# Patient Record
Sex: Male | Born: 1995 | Race: Black or African American | Hispanic: No | Marital: Married | Smoking: Never smoker
Health system: Southern US, Community
[De-identification: ages and names within clinical notes are randomized; demographics above are authoritative.]

## PROBLEM LIST (undated history)

## (undated) DIAGNOSIS — S060X9A Concussion with loss of consciousness of unspecified duration, initial encounter: Secondary | ICD-10-CM

## (undated) DIAGNOSIS — J4520 Mild intermittent asthma, uncomplicated: Secondary | ICD-10-CM

## (undated) DIAGNOSIS — S060XAA Concussion with loss of consciousness status unknown, initial encounter: Secondary | ICD-10-CM

## (undated) HISTORY — DX: Mild intermittent asthma, uncomplicated: J45.20

## (undated) HISTORY — PX: OTHER SURGICAL HISTORY: SHX169

## (undated) HISTORY — PX: APPENDECTOMY: SHX54

---
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
## 2015-07-08 IMAGING — CR DG CHEST 2V
2 series · 2 of 2 positions shown · non-contrast
Comparison: 11/26/2012.

CLINICAL DATA: Chest pain.

EXAM:
CHEST  2 VIEW

[w chest pa]
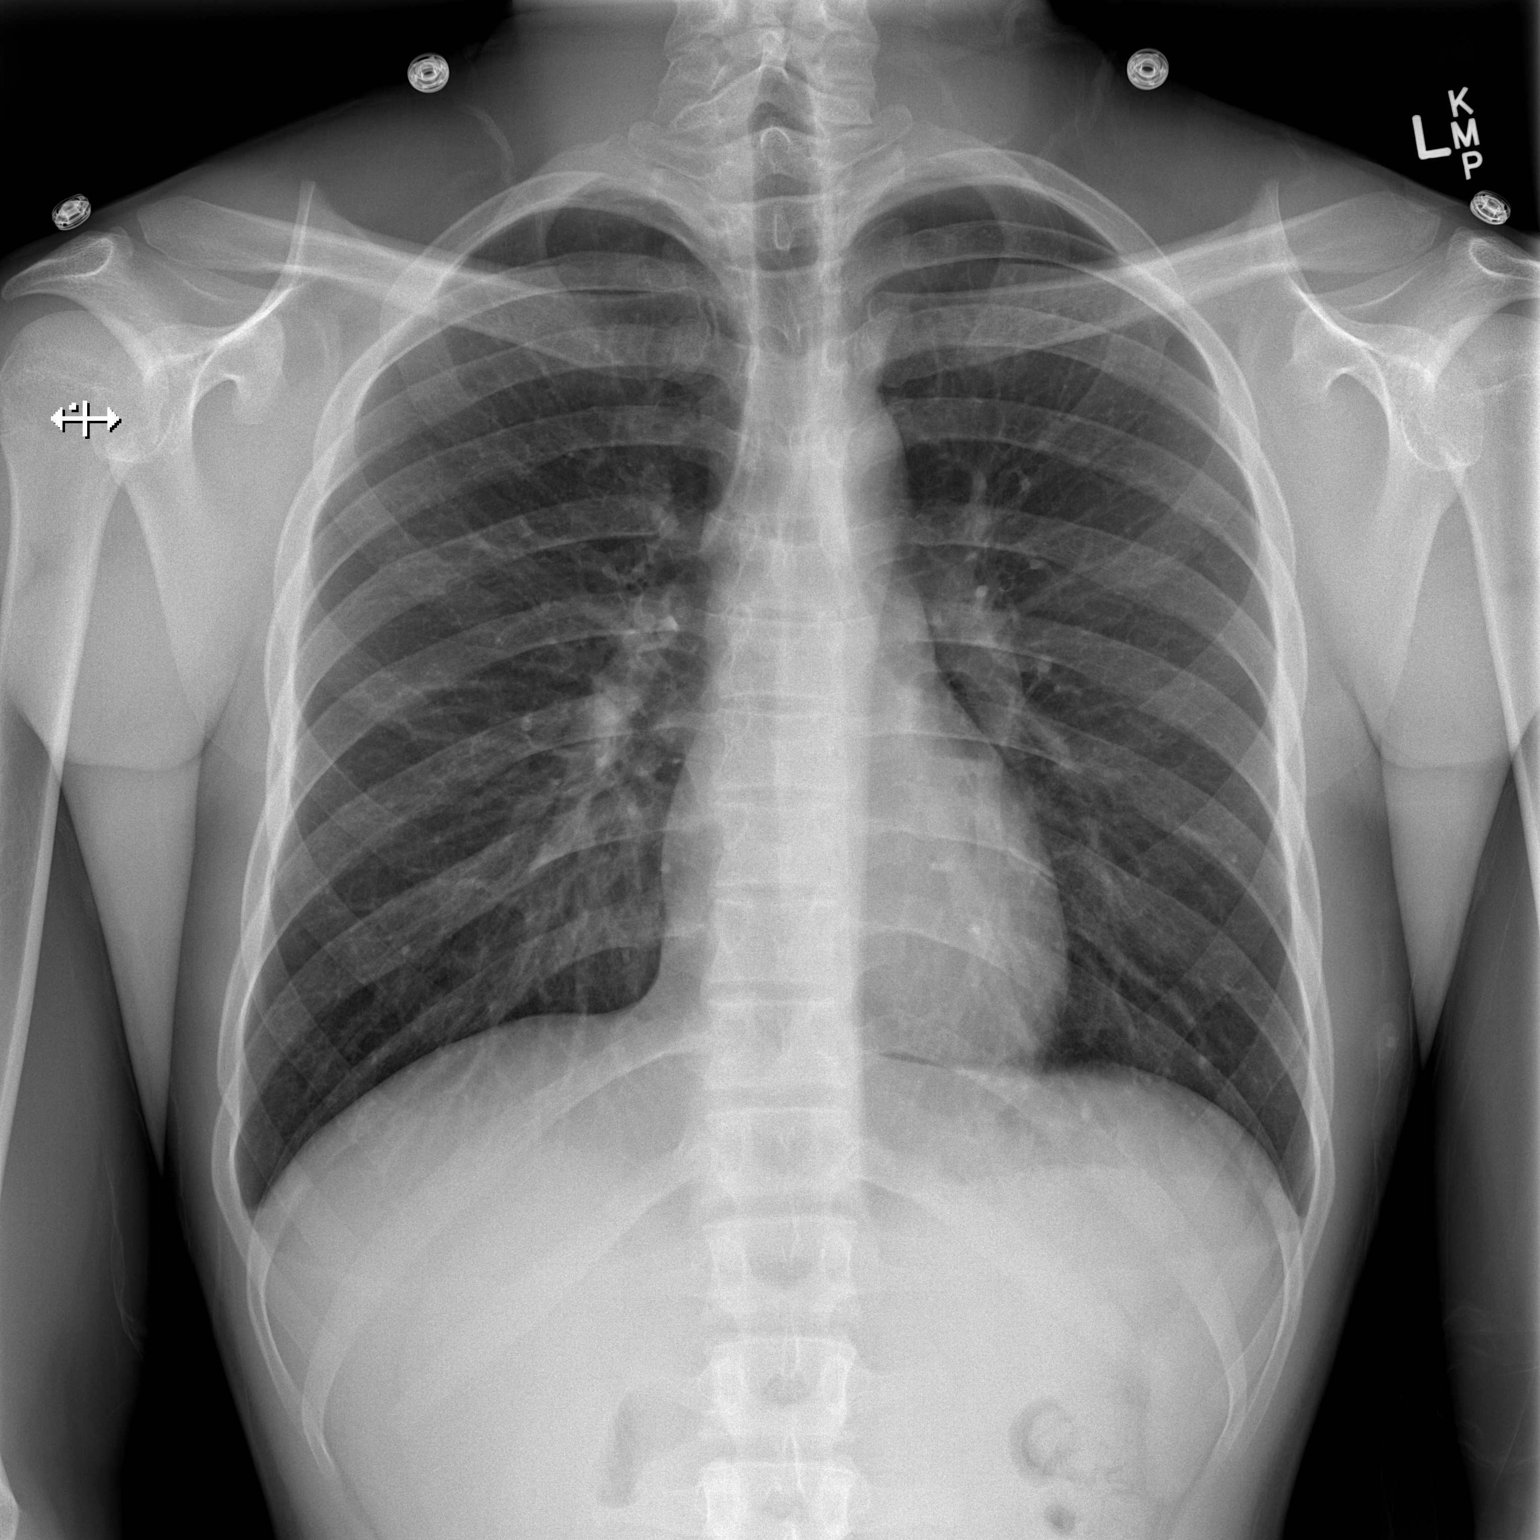

[w chest lat]
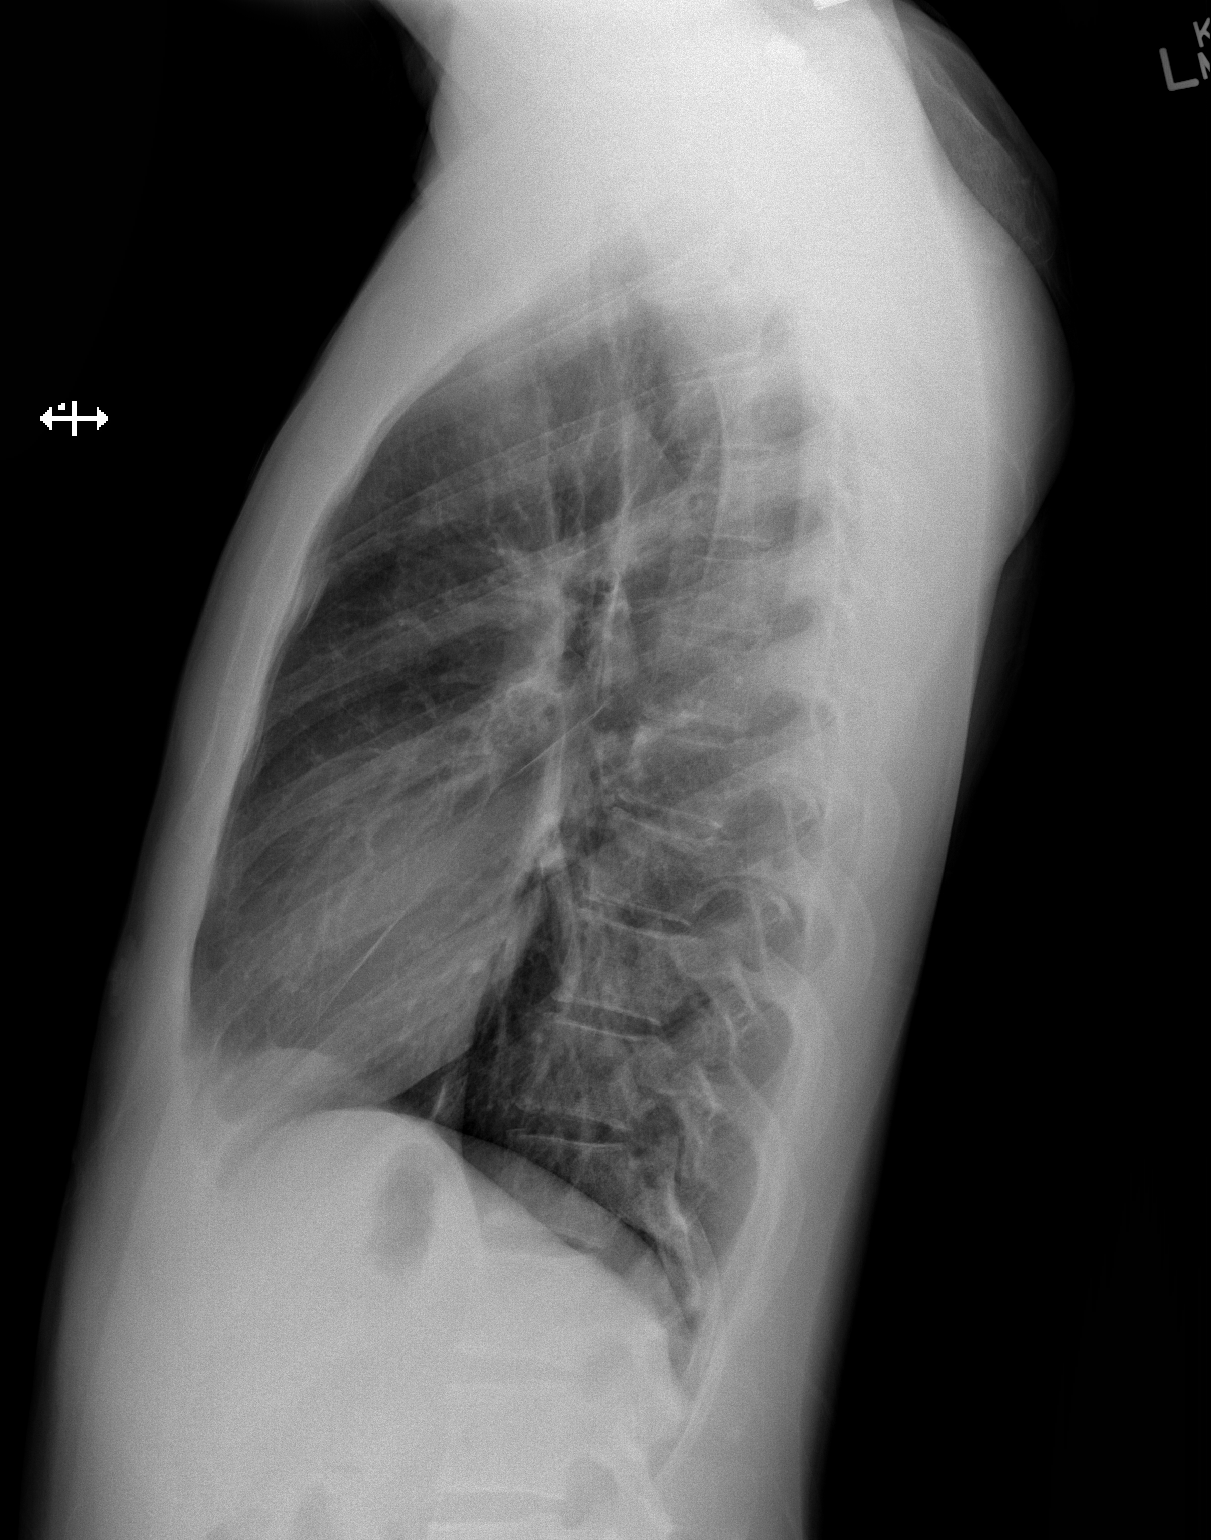

[2 of 2 positions shown; findings below may reference images not displayed]

FINDINGS: Normal heart size and mediastinal contours. No acute infiltrate or
edema. No effusion or pneumothorax. No acute osseous findings.
IMPRESSION: No active cardiopulmonary disease.

## 2016-01-02 ENCOUNTER — Ambulatory Visit: Admission: RE | Admit: 2016-01-02 | Payer: Medicaid Other | Source: Ambulatory Visit

## 2016-01-02 ENCOUNTER — Emergency Department
Admission: EM | Admit: 2016-01-02 | Discharge: 2016-01-02 | Discharge status: Absent without leave | Payer: Medicaid Other | Attending: Emergency Medicine | Admitting: Emergency Medicine

## 2016-01-02 MED ORDER — LORAZEPAM 2 MG/ML IJ SOLN
INTRAMUSCULAR | Status: AC
  Filled 2016-01-02: qty 1

## 2016-01-02 MED ORDER — SODIUM CHLORIDE 0.9 % IV SOLN: 1000.0000 mL | Freq: Once | INTRAVENOUS | Status: DC

## 2017-05-22 ENCOUNTER — Encounter (HOSPITAL_COMMUNITY): Payer: Self-pay | Admitting: Emergency Medicine

## 2017-05-22 ENCOUNTER — Ambulatory Visit (HOSPITAL_COMMUNITY)
Admission: EM | Admit: 2017-05-22 | Discharge: 2017-05-22 | Disposition: A | Discharge status: Home / Self Care | Payer: Self-pay | Attending: Internal Medicine | Admitting: Internal Medicine

## 2017-05-22 DIAGNOSIS — J029 Acute pharyngitis, unspecified: Secondary | ICD-10-CM | POA: Insufficient documentation

## 2017-05-22 DIAGNOSIS — Z79899 Other long term (current) drug therapy: Secondary | ICD-10-CM | POA: Insufficient documentation

## 2017-05-22 DIAGNOSIS — R509 Fever, unspecified: Secondary | ICD-10-CM

## 2017-05-22 DIAGNOSIS — J45909 Unspecified asthma, uncomplicated: Secondary | ICD-10-CM | POA: Insufficient documentation

## 2017-05-22 DIAGNOSIS — R5383 Other fatigue: Secondary | ICD-10-CM

## 2017-05-22 DIAGNOSIS — F322 Major depressive disorder, single episode, severe without psychotic features: Secondary | ICD-10-CM | POA: Insufficient documentation

## 2017-05-22 LAB — POCT INFECTIOUS MONO SCREEN: Mono Screen: NEGATIVE

## 2017-05-22 MED ORDER — AMOXICILLIN 500 MG PO CAPS: 500.0000 mg | ORAL_CAPSULE | Freq: Two times a day (BID) | ORAL | 0 refills | Status: AC

## 2017-05-22 MED ORDER — IBUPROFEN 800 MG PO TABS
ORAL_TABLET | ORAL | Status: AC
  Filled 2017-05-22: qty 1

## 2017-05-22 MED ORDER — ONDANSETRON 4 MG PO TBDP
4.0000 mg | ORAL_TABLET | Freq: Once | ORAL | Status: AC
  Administered 2017-05-22: 4 mg via ORAL

## 2017-05-22 MED ORDER — SODIUM CHLORIDE 0.9 % IV BOLUS
1000.0000 mL | Freq: Once | INTRAVENOUS | Status: AC
  Administered 2017-05-22: 1000 mL via INTRAVENOUS

## 2017-05-22 MED ORDER — ONDANSETRON 4 MG PO TBDP
ORAL_TABLET | ORAL | Status: AC
  Filled 2017-05-22: qty 1

## 2017-05-22 MED ORDER — IBUPROFEN 800 MG PO TABS
800.0000 mg | ORAL_TABLET | Freq: Once | ORAL | Status: AC
  Administered 2017-05-22: 800 mg via ORAL

## 2017-05-22 NOTE — Discharge Instructions (Signed)
You were negative for strep and mono today. We have sent your throat swab to be cultured. Will notify you of any positive findings from your urine, blood and throat, and if any changes to treatment are needed.   Increase your fluid intake. Tylenol and/or ibuprofen as needed for pain or fevers.  Complete course of antibiotics.   If no improvement or if worsening of symptoms in the next 3-5 days please return or go to ER for further evaluation.

## 2017-05-22 NOTE — ED Triage Notes (Signed)
Pt c/o sore throat, fatigue, fever since Friday. Pt has temp of 103, states he has tried both tylenol and ibuprofen less than 6 hours ago, still high fever. Pt states he got dizzy and fell, came awake right away. Pt c/o fatigue, denies hitting head when they fell.

## 2017-05-22 NOTE — ED Provider Notes (Signed)
[facility]    CSN: [id number] Arrival date & time: 05/22/17  1801     History   Chief Complaint Chief Complaint  Patient presents with  . Fever  . Fatigue    HPI Jacob Wolf is a 22 y.o. male.   Jacob presents with complaints of decreased appetite, headache, waekness, fevers, sore throat which started approximately 3/22. Felt like he was going to pass out from weakness this afternoon. Has been drinking some fluids but minimal due to severity of sore throat. Urinating. Has had some loose stool. Mild nausea without vomiting. No known ill contacts. Took theraflu at 1400 today. Ibuprofen yesterday. Without rash. States he is also interested in STD screening. Hx of asthma. Without cough or congestion.     ROS per HPI.      Past Medical History:  Diagnosis Date  . Asthma   . Asthma, mild intermittent 10/29/2011  . Concussion     Patient Active Problem List   Diagnosis Date Noted  . Severe major depression, single episode (HCC) 12/05/2014  . 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 Asthma, mild intermittent 10/29/2011    Past Surgical History:  Procedure Laterality Date  . APPENDECTOMY    . LAPAROSCOPIC APPENDECTOMY  04/16/2011   Procedure: APPENDECTOMY LAPAROSCOPIC;  Surgeon: Judie Petit. Leonia Corona, MD;  Location: [facility];  Service: Pediatrics;  Laterality: N/A;  . Lt toe removal         Home Medications    Prior to Admission medications   Medication Sig Start Date End Date Taking? Authorizing Provider  albuterol (PROVENTIL HFA;VENTOLIN HFA) 108 (90 BASE) MCG/ACT inhaler Inhale 2 puffs into the lungs every 4 (four) hours as needed for wheezing or shortness of breath. For shortness of breath Patient not taking: Reported on 06/27/2014 12/16/11   Preston Fleeting, MD  amoxicillin (AMOXIL) 500 MG capsule Take 1 capsule (500 mg total) by mouth 2 (two) times daily for 10 days. 05/22/17 06/01/17  Georgetta Haber, NP    citalopram (CELEXA) 20 MG tablet Take 1 tablet (20 mg total) by mouth daily. Patient not taking: Reported on 05/22/2017 12/09/14   Beau Fanny, FNP  hydrOXYzine (ATARAX/VISTARIL) 25 MG tablet Take 1 tablet (25 mg total) by mouth every 6 (six) hours as needed for anxiety. Patient not taking: Reported on 05/22/2017 12/09/14   Beau Fanny, FNP  traZODone (DESYREL) 50 MG tablet Take 1 tablet (50 mg total) by mouth at bedtime as needed for sleep. Patient not taking: Reported on 05/22/2017 12/09/14   Beau Fanny, FNP    Family History History reviewed. No pertinent family history.  Social History Social History   Tobacco Use  . Smoking status: Never Smoker  . Smokeless tobacco: Never Used  Substance Use Topics  . Alcohol use: No  . Drug use: No     Allergies   Patient has no known allergies.   Review of Systems Review of Systems   Physical Exam Triage Vital Signs ED Triage Vitals [05/22/17 1820]  Enc Vitals Group     BP 111/65     Pulse Rate (!) 123     Resp 18     Temp (!) 103 F (39.4 C)     Temp src      SpO2 100 %     Weight      Height      Head Circumference      Peak Flow  Pain Score      Pain Loc      Pain Edu?      Excl. in GC?    No data found.  Updated Vital Signs BP 112/66 (BP Location: Right Arm)   Pulse (!) 108   Temp (!) 100.4 F (38 C) (Oral)   Resp 18   SpO2 98%   Visual Acuity Right Eye Distance:   Left Eye Distance:   Bilateral Distance:    Right Eye Near:   Left Eye Near:    Bilateral Near:     Physical Exam  Constitutional: He is oriented to person, place, and time. He appears well-developed and well-nourished.  HENT:  Head: Normocephalic and atraumatic.  Right Ear: Tympanic membrane, external ear and ear canal normal.  Left Ear: Tympanic membrane, external ear and ear canal normal.  Nose: Nose normal. Right sinus exhibits no maxillary sinus tenderness and no frontal sinus tenderness. Left sinus exhibits no  maxillary sinus tenderness and no frontal sinus tenderness.  Mouth/Throat: Uvula is midline, oropharynx is clear and moist and mucous membranes are normal. No tonsillar abscesses. Tonsils are 3+ on the right. Tonsils are 3+ on the left. Tonsillar exudate.  White plaquing to tongue noted with dryness   Eyes: Pupils are equal, round, and reactive to light. Conjunctivae are normal.  Neck: Normal range of motion.  Cardiovascular: Regular rhythm. Tachycardia present.  Pulmonary/Chest: Effort normal and breath sounds normal.  Lymphadenopathy:    He has no cervical adenopathy.  Neurological: He is alert and oriented to person, place, and time.  Skin: Skin is warm and dry.  Vitals reviewed.    UC Treatments / Results  Labs (all labs ordered are listed, but only abnormal results are displayed) Labs Reviewed  CULTURE, GROUP A STREP ([facility])  HIV ANTIBODY (ROUTINE TESTING)  RPR  POCT INFECTIOUS MONO SCREEN  URINE CYTOLOGY ANCILLARY ONLY    EKG None Radiology No results found.  Procedures Procedures (including critical care time)  Medications Ordered in UC Medications  sodium chloride 0.9 % bolus 1,000 mL (1,000 mLs Intravenous New Bag/Given 05/22/17 1923)  ondansetron (ZOFRAN-ODT) disintegrating tablet 4 mg (4 mg Oral Given 05/22/17 1904)  ibuprofen (ADVIL,MOTRIN) tablet 800 mg (800 mg Oral Given 05/22/17 1907)     Initial Impression / Assessment and Plan / UC Course  I have reviewed the triage vital signs and the nursing notes.  Pertinent labs & imaging results that were available during my care of the patient were reviewed by me and considered in my medical decision making (see chart for details).     Negative strep and mono despite significant fever and exam findings. Will treat with amoxicillin at this time. Patient states feels much improved after treatment here. Encouraged to push fluids, continue with tylenol/ibuprofen for pain/fever control. Return precautions provided.  Patient verbalized understanding and agreeable to plan.  Will notify of any positive findings and if any changes to treatment are needed.    Final Clinical Impressions(s) / UC Diagnoses   Final diagnoses:  Acute pharyngitis, unspecified etiology    ED Discharge Orders        Ordered    amoxicillin (AMOXIL) 500 MG capsule  2 times daily     05/22/17 1959       Controlled Substance Prescriptions [place] Controlled Substance Registry consulted? Not Applicable   Georgetta HaberBurky, Zarielle Cea B, NP 05/22/17 2002

## 2017-05-24 LAB — HIV ANTIBODY (ROUTINE TESTING W REFLEX): HIV Screen 4th Generation wRfx: NONREACTIVE

## 2017-05-24 LAB — RPR: RPR: NONREACTIVE

## 2017-05-25 LAB — CULTURE, GROUP A STREP (THRC)

## 2017-05-26 LAB — URINE CYTOLOGY ANCILLARY ONLY
CHLAMYDIA, DNA PROBE: NEGATIVE
NEISSERIA GONORRHEA: NEGATIVE
TRICH (WINDOWPATH): NEGATIVE

## 2017-07-23 ENCOUNTER — Ambulatory Visit (HOSPITAL_COMMUNITY)
Admission: EM | Admit: 2017-07-23 | Discharge: 2017-07-23 | Disposition: A | Discharge status: Home / Self Care | Payer: Medicaid Other | Attending: Family Medicine | Admitting: Family Medicine

## 2017-07-23 ENCOUNTER — Encounter (HOSPITAL_COMMUNITY): Payer: Self-pay | Admitting: Emergency Medicine

## 2017-07-23 DIAGNOSIS — A084 Viral intestinal infection, unspecified: Secondary | ICD-10-CM

## 2017-07-23 MED ORDER — ONDANSETRON 4 MG PO TBDP: 4.0000 mg | ORAL_TABLET | Freq: Three times a day (TID) | ORAL | 0 refills | Status: DC | PRN

## 2017-07-23 NOTE — ED Triage Notes (Signed)
Pt here for N/V and abd pain

## 2017-07-23 NOTE — ED Provider Notes (Signed)
[facility]   [id number] 07/23/17 Arrival Time: 1538  ASSESSMENT & PLAN:  1. Viral gastroenteritis    Meds ordered this encounter  Medications  . ondansetron (ZOFRAN-ODT) 4 MG disintegrating tablet    Sig: Take 1 tablet (4 mg total) by mouth every 8 (eight) hours as needed for nausea or vomiting.    Dispense:  15 tablet    Refill:  0   Discussed typical duration of symptoms for suspected viral GI illness. Will do his best to ensure adequate fluid intake in order to avoid dehydration. Will proceed to the Emergency Department for evaluation if unable to tolerate PO fluids regularly.  Otherwise he will f/u with his PCP or here if not showing improvement over the next 48-72 hours.  Reviewed expectations re: course of current medical issues. Questions answered. Outlined signs and symptoms indicating need for more acute intervention. Patient verbalized understanding. After Visit Summary given.   SUBJECTIVE: History from: patient.  Jacob Wolf is a 22 y.o. male who presents with complaint of non-bloody intermittent nausea and vomiting of undigested food with diarrhea. Onset abrupt, yesterday. Abdominal discomfort: mild and cramping. Symptoms are unchanged since beginning. Aggravating factors: eating. Alleviating factors: none. Associated symptoms: fatigue. He denies fever. Appetite: decreased. PO intake: decreased. Ambulatory without assistance. Urinary symptoms: none. Last bowel movement without blood. OTC treatment: none.  Past Surgical History:  Procedure Laterality Date  . APPENDECTOMY    . LAPAROSCOPIC APPENDECTOMY  04/16/2011   Procedure: APPENDECTOMY LAPAROSCOPIC;  Surgeon: Judie Petit. Leonia Corona, MD;  Location: [facility];  Service: Pediatrics;  Laterality: N/A;  . Lt toe removal      ROS: As per HPI.  OBJECTIVE:  Vitals:   07/23/17 1610  Pulse: 82  Resp: 18  Temp: 98.5 F (36.9 C)  TempSrc: Oral  SpO2: 98%    General appearance: alert; no  distress Oropharynx: moist Lungs: clear to auscultation bilaterally Heart: regular rate and rhythm Abdomen: soft; non-distended; no significant abdominal tenderness, "cramping feeling"; bowel sounds present; no masses or organomegaly; no guarding or rebound tenderness Back: no CVA tenderness Extremities: no edema; symmetrical with no gross deformities Skin: warm and dry Neurologic: normal gait Psychological: alert and cooperative; normal mood and affect   No Known Allergies                                             Past Medical History:  Diagnosis Date  . Asthma   . Asthma, mild intermittent 10/29/2011  . Concussion    Social History   Socioeconomic History  . Marital status: Single    Spouse name: Not on file  . Number of children: Not on file  . Years of education: Not on file  . Highest education level: Not on file  Occupational History  . Not on file  Social Needs  . Financial resource strain: Not on file  . Food insecurity:    Worry: Not on file    Inability: Not on file  . Transportation needs:    Medical: Not on file    Non-medical: Not on file  Tobacco Use  . Smoking status: Never Smoker  . Smokeless tobacco: Never Used  Substance and Sexual Activity  . Alcohol use: No  . Drug use: No  . Sexual activity: Never  Lifestyle  . Physical activity:    Days per week: Not on file  Minutes per session: Not on file  . Stress: Not on file  Relationships  . Social connections:    Talks on phone: Not on file    Gets together: Not on file    Attends religious service: Not on file    Active member of club or organization: Not on file    Attends meetings of clubs or organizations: Not on file    Relationship status: Not on file  . Intimate partner violence:    Fear of current or ex partner: Not on file    Emotionally abused: Not on file    Physically abused: Not on file    Forced sexual activity: Not on file  Other Topics Concern  . Not on file  Social  History Narrative   [employer]   10 th grade.         History reviewed. No pertinent family history.   Mardella Layman, MD 08/07/17 1017

## 2017-07-23 NOTE — Discharge Instructions (Addendum)

## 2018-03-02 ENCOUNTER — Encounter (HOSPITAL_COMMUNITY): Payer: Self-pay

## 2018-03-02 ENCOUNTER — Other Ambulatory Visit: Payer: Self-pay

## 2018-03-02 ENCOUNTER — Ambulatory Visit (HOSPITAL_COMMUNITY)
Admission: EM | Admit: 2018-03-02 | Discharge: 2018-03-02 | Disposition: A | Discharge status: Home / Self Care | Payer: BLUE CROSS/BLUE SHIELD | Attending: Internal Medicine | Admitting: Internal Medicine

## 2018-03-02 DIAGNOSIS — J069 Acute upper respiratory infection, unspecified: Secondary | ICD-10-CM | POA: Diagnosis not present

## 2018-03-02 DIAGNOSIS — B9789 Other viral agents as the cause of diseases classified elsewhere: Secondary | ICD-10-CM | POA: Diagnosis not present

## 2018-03-02 DIAGNOSIS — F5101 Primary insomnia: Secondary | ICD-10-CM | POA: Insufficient documentation

## 2018-03-02 MED ORDER — TRAZODONE HCL 50 MG PO TABS: 50.0000 mg | ORAL_TABLET | Freq: Every evening | ORAL | 0 refills | Status: DC | PRN

## 2018-03-02 NOTE — Discharge Instructions (Signed)
I believe that you have a viral upper respiratory infection that is now improving Work note given I refill your trazodone to help with your sleep Please follow-up with primary care for further evaluation and management

## 2018-03-02 NOTE — ED Triage Notes (Signed)
Pt cc sore throat and congestion pt states he was out in the rain on Friday.

## 2018-03-02 NOTE — ED Provider Notes (Addendum)
[facility]    CSN: [id number] Arrival date & time: 03/02/18  1304     History   Chief Complaint Chief Complaint  Patient presents with  . Sore Throat    HPI Jacob Wolf is a 23 y.o. male.   Is a 23 year old male has medical history of asthma, depression.  He presents with approximately 3 days of cough, congestion, runny nose.  This started on Friday after working outside in the rain.  His symptoms have since improved but his boss wanted him to get a note for work.  He has not been taking anything for his symptoms.  Denies any fever, chills, body, night sweats.  Denies any recent sick contacts or recent traveling.  He is also here with reports of not being able to sleep at night.  This is chronic for him and has gone on  for many years.  He previously was on trazodone to help with sleep a few years back.  He reports this helped him.  Since not taking that anymore he has been using over-the-counter Tylenol PM, Aleve PM, Benadryl and other nighttime remedies.  He reports this helps for approximately 1 to 2 hours but then wakes up with racing thoughts in his head.  He does have a history of depression but denies any depression or anxiety today or suicidal ideations.  Denies any dizziness, headaches, blurred vision.  Denies any chest pain, shortness of breath, palpitations.  ROS per HPI      Past Medical History:  Diagnosis Date  . Asthma   . Asthma, mild intermittent 10/29/2011  . Concussion     Patient Active Problem List   Diagnosis Date Noted  . Severe major depression, single episode (HCC) 12/05/2014  . 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 Asthma, mild intermittent 10/29/2011    Past Surgical History:  Procedure Laterality Date  . APPENDECTOMY    . LAPAROSCOPIC APPENDECTOMY  04/16/2011   Procedure: APPENDECTOMY LAPAROSCOPIC;  Surgeon: Judie PetitM. Leonia CoronaShuaib Farooqui, MD;  Location: [facility];  Service: Pediatrics;   Laterality: N/A;  . Lt toe removal         Home Medications    Prior to Admission medications   Medication Sig Start Date End Date Taking? Authorizing Provider  albuterol (PROVENTIL HFA;VENTOLIN HFA) 108 (90 BASE) MCG/ACT inhaler Inhale 2 puffs into the lungs every 4 (four) hours as needed for wheezing or shortness of breath. For shortness of breath Patient not taking: Reported on 06/27/2014 12/16/11   Preston FleetingHooker, James B, MD  citalopram (CELEXA) 20 MG tablet Take 1 tablet (20 mg total) by mouth daily. Patient not taking: Reported on 05/22/2017 12/09/14   Beau FannyWithrow, John C, FNP  hydrOXYzine (ATARAX/VISTARIL) 25 MG tablet Take 1 tablet (25 mg total) by mouth every 6 (six) hours as needed for anxiety. Patient not taking: Reported on 05/22/2017 12/09/14   Beau FannyWithrow, John C, FNP  ondansetron (ZOFRAN-ODT) 4 MG disintegrating tablet Take 1 tablet (4 mg total) by mouth every 8 (eight) hours as needed for nausea or vomiting. 07/23/17   Mardella LaymanHagler, Brian, MD  traZODone (DESYREL) 50 MG tablet Take 1 tablet (50 mg total) by mouth at bedtime as needed for sleep. 03/02/18   Janace ArisBast, Chavela Justiniano A, NP    Family History History reviewed. No pertinent family history.  Social History Social History   Tobacco Use  . Smoking status: Never Smoker  . Smokeless tobacco: Never Used  Substance Use Topics  . Alcohol use: No  .  Drug use: No     Allergies   Patient has no known allergies.   Review of Systems Review of Systems   Physical Exam Triage Vital Signs ED Triage Vitals  Enc Vitals Group     BP 03/02/18 1354 124/70     Pulse Rate 03/02/18 1354 83     Resp 03/02/18 1354 16     Temp 03/02/18 1354 97.9 F (36.6 C)     Temp src --      SpO2 03/02/18 1354 100 %     Weight 03/02/18 1353 150 lb (68 kg)     Height --      Head Circumference --      Peak Flow --      Pain Score 03/02/18 1352 1     Pain Loc --      Pain Edu? --      Excl. in GC? --    No data found.  Updated Vital Signs BP 124/70 (BP  Location: Right Arm)   Pulse 83   Temp 97.9 F (36.6 C)   Resp 16   Wt 150 lb (68 kg)   SpO2 100%   BMI 24.58 kg/m   Visual Acuity Right Eye Distance:   Left Eye Distance:   Bilateral Distance:    Right Eye Near:   Left Eye Near:    Bilateral Near:     Physical Exam Vitals signs and nursing note reviewed.  Constitutional:      General: He is not in acute distress.    Appearance: He is well-developed. He is not ill-appearing, toxic-appearing or diaphoretic.  HENT:     Head: Normocephalic and atraumatic.     Right Ear: Tympanic membrane and ear canal normal.     Left Ear: Tympanic membrane normal.     Mouth/Throat:     Pharynx: Oropharynx is clear.     Tonsils: No tonsillar exudate. Swelling: 0 on the right. 0 on the left.  Eyes:     Conjunctiva/sclera: Conjunctivae normal.  Neck:     Musculoskeletal: Normal range of motion.  Cardiovascular:     Rate and Rhythm: Normal rate and regular rhythm.     Heart sounds: Normal heart sounds. No murmur.  Pulmonary:     Effort: Pulmonary effort is normal.  Lymphadenopathy:     Cervical: No cervical adenopathy.  Skin:    General: Skin is warm and dry.  Neurological:     General: No focal deficit present.     Mental Status: He is alert.  Psychiatric:        Mood and Affect: Mood normal.      UC Treatments / Results  Labs (all labs ordered are listed, but only abnormal results are displayed) Labs Reviewed - No data to display  EKG None  Radiology No results found.  Procedures Procedures (including critical care time)  Medications Ordered in UC Medications - No data to display  Initial Impression / Assessment and Plan / UC Course  I have reviewed the triage vital signs and the nursing notes.  Pertinent labs & imaging results that were available during my care of the patient were reviewed by me and considered in my medical decision making (see chart for details).     Exam normal Note given for work  I think  it would be appropriate to give patient a few trazodone until he can get follow-up with primary care provider for chronic management.  Pt denies any depression, anxiety or SI  today.  Primary care provider contacts placed on discharge instructions.  Final Clinical Impressions(s) / UC Diagnoses   Final diagnoses:  Primary insomnia  Viral URI with cough     Discharge Instructions     I believe that you have a viral upper respiratory infection that is now improving Work note given I refill your trazodone to help with your sleep Please follow-up with primary care for further evaluation and management    ED Prescriptions    Medication Sig Dispense Auth. Provider   traZODone (DESYREL) 50 MG tablet Take 1 tablet (50 mg total) by mouth at bedtime as needed for sleep. 14 tablet Dahlia Byes A, NP     Controlled Substance Prescriptions [place] Controlled Substance Registry consulted? Not Applicable        Janace Aris, NP 03/02/18 1505

## 2018-03-15 ENCOUNTER — Other Ambulatory Visit: Payer: Self-pay

## 2018-03-15 ENCOUNTER — Encounter (HOSPITAL_COMMUNITY): Payer: Self-pay | Admitting: Emergency Medicine

## 2018-03-15 ENCOUNTER — Ambulatory Visit (HOSPITAL_COMMUNITY)
Admission: EM | Admit: 2018-03-15 | Discharge: 2018-03-15 | Disposition: A | Discharge status: Home / Self Care | Payer: BLUE CROSS/BLUE SHIELD | Attending: Family Medicine | Admitting: Family Medicine

## 2018-03-15 DIAGNOSIS — Z760 Encounter for issue of repeat prescription: Secondary | ICD-10-CM | POA: Diagnosis not present

## 2018-03-15 DIAGNOSIS — F419 Anxiety disorder, unspecified: Secondary | ICD-10-CM | POA: Diagnosis not present

## 2018-03-15 MED ORDER — HYDROXYZINE HCL 25 MG PO TABS: 25.0000 mg | ORAL_TABLET | Freq: Four times a day (QID) | ORAL | 0 refills | Status: DC | PRN

## 2018-03-15 MED ORDER — TRAZODONE HCL 50 MG PO TABS: 50.0000 mg | ORAL_TABLET | Freq: Every evening | ORAL | 1 refills | Status: DC | PRN

## 2018-03-15 MED ORDER — CITALOPRAM HYDROBROMIDE 20 MG PO TABS: 20.0000 mg | ORAL_TABLET | Freq: Every day | ORAL | 1 refills | Status: DC

## 2018-03-15 NOTE — ED Triage Notes (Addendum)
Patient wants refill for medication  Patient seen a few weeks ago for uri symptoms.  At that time says provider offered to refill depression and anxiety medicines-patient declined offer.  Today he has returned hopeful to get medication refilled

## 2018-03-15 NOTE — Discharge Instructions (Addendum)
Medicines refilled See the Primary care Doctor in follow up

## 2018-03-15 NOTE — ED Provider Notes (Signed)
[facility]    CSN: [id number] Arrival date & time: 03/15/18  1425     History   Chief Complaint Chief Complaint  Patient presents with  . Anxiety    HPI Jacob Wolf is a 23 y.o. male.   HPI Patient previously hospitalized for depression.  Took Celexa, trazodone for sleep, hydroxyzine for anxiety.  He is here requesting a refill of these medications.  He does not have a PCP.  He states he feels like he is "out of control".  Very anxious.  Easily angry.  Argumentative with his girlfriend.  He would like to get back on medication until he can get in with a PCP.  He does not feel like he needs a psychiatrist, no thoughts of harming himself or others.  He did well on these medications before Past Medical History:  Diagnosis Date  . Asthma   . Asthma, mild intermittent 10/29/2011  . Concussion     Patient Active Problem List   Diagnosis Date Noted  . Severe major depression, single episode (HCC) 12/05/2014  . 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 Asthma, mild intermittent 10/29/2011    Past Surgical History:  Procedure Laterality Date  . APPENDECTOMY    . LAPAROSCOPIC APPENDECTOMY  04/16/2011   Procedure: APPENDECTOMY LAPAROSCOPIC;  Surgeon: Judie PetitM. Leonia CoronaShuaib Farooqui, MD;  Location: [facility];  Service: Pediatrics;  Laterality: N/A;  . Lt toe removal         Home Medications    Prior to Admission medications   Medication Sig Start Date End Date Taking? Authorizing Provider  citalopram (CELEXA) 20 MG tablet Take 1 tablet (20 mg total) by mouth daily. 03/15/18   Eustace MooreNelson, Herminia Warren Sue, MD  hydrOXYzine (ATARAX/VISTARIL) 25 MG tablet Take 1 tablet (25 mg total) by mouth every 6 (six) hours as needed for anxiety. 03/15/18   Eustace MooreNelson, Ravenne Wayment Sue, MD  traZODone (DESYREL) 50 MG tablet Take 1 tablet (50 mg total) by mouth at bedtime as needed for sleep. 03/15/18   Eustace MooreNelson, Jamien Casanova Sue, MD    Family History No family history on  file.  Social History Social History   Tobacco Use  . Smoking status: Never Smoker  . Smokeless tobacco: Never Used  Substance Use Topics  . Alcohol use: No  . Drug use: No     Allergies   Patient has no known allergies.   Review of Systems Review of Systems  Constitutional: Negative for chills and fever.  HENT: Negative for ear pain and sore throat.   Eyes: Negative for pain and visual disturbance.  Respiratory: Negative for cough and shortness of breath.   Cardiovascular: Negative for chest pain and palpitations.  Gastrointestinal: Negative for abdominal pain and vomiting.  Genitourinary: Negative for dysuria and hematuria.  Musculoskeletal: Negative for arthralgias and back pain.  Skin: Negative for color change and rash.  Neurological: Negative for seizures and syncope.  Psychiatric/Behavioral: Positive for dysphoric mood and sleep disturbance. The patient is nervous/anxious.   All other systems reviewed and are negative.    Physical Exam Triage Vital Signs ED Triage Vitals  Enc Vitals Group     BP 03/15/18 1626 108/67     Pulse Rate 03/15/18 1626 61     Resp 03/15/18 1626 16     Temp 03/15/18 1626 98.3 F (36.8 C)     Temp Source 03/15/18 1626 Temporal     SpO2 03/15/18 1626 100 %     Weight --  Height --      Head Circumference --      Peak Flow --      Pain Score 03/15/18 1622 0     Pain Loc --      Pain Edu? --      Excl. in GC? --    No data found.  Updated Vital Signs BP 108/67 (BP Location: Right Arm)   Pulse 61   Temp 98.3 F (36.8 C) (Temporal)   Resp 16   SpO2 100%   Visual Acuity Right Eye Distance:   Left Eye Distance:   Bilateral Distance:    Right Eye Near:   Left Eye Near:    Bilateral Near:     Physical Exam Constitutional:      General: He is not in acute distress.    Appearance: He is well-developed.  HENT:     Head: Normocephalic and atraumatic.  Eyes:     Conjunctiva/sclera: Conjunctivae normal.     Pupils:  Pupils are equal, round, and reactive to light.  Neck:     Musculoskeletal: Normal range of motion.  Cardiovascular:     Rate and Rhythm: Normal rate.  Pulmonary:     Effort: Pulmonary effort is normal. No respiratory distress.  Abdominal:     General: There is no distension.     Palpations: Abdomen is soft.  Musculoskeletal: Normal range of motion.  Skin:    General: Skin is warm and dry.  Neurological:     Mental Status: He is alert.  Psychiatric:        Mood and Affect: Mood normal.        Behavior: Behavior normal.        Thought Content: Thought content normal.     Comments: Clear thinking.  Good judgment.      UC Treatments / Results  Labs (all labs ordered are listed, but only abnormal results are displayed) Labs Reviewed - No data to display  EKG None  Radiology No results found.  Procedures Procedures (including critical care time)  Medications Ordered in UC Medications - No data to display  Initial Impression / Assessment and Plan / UC Course  I have reviewed the triage vital signs and the nursing notes.  Pertinent labs & imaging results that were available during my care of the patient were reviewed by me and considered in my medical decision making (see chart for details).     Reviewed that he does need to be on an SSRI this is the drug of choice for anxiety and depression.  Discussed how the medication works.  He was on it before with good results.  He would like to go back on the same medicine as previous.  I did explain to him that this is a chronic condition that needs to be managed by primary care doctor.  I given him the number for primary care doctor to follow-up with.  I gave him enough medicine to last until he can be seen by the doctor. Final Clinical Impressions(s) / UC Diagnoses   Final diagnoses:  Anxiety  Encounter for medication refill     Discharge Instructions     Medicines refilled See the Primary care Doctor in follow  up   ED Prescriptions    Medication Sig Dispense Auth. Provider   citalopram (CELEXA) 20 MG tablet Take 1 tablet (20 mg total) by mouth daily. 30 tablet Eustace Moore, MD   hydrOXYzine (ATARAX/VISTARIL) 25 MG tablet Take 1 tablet (25  mg total) by mouth every 6 (six) hours as needed for anxiety. 30 tablet Eustace MooreNelson, Saban Heinlen Sue, MD   traZODone (DESYREL) 50 MG tablet Take 1 tablet (50 mg total) by mouth at bedtime as needed for sleep. 30 tablet Eustace MooreNelson, Vassie Kugel Sue, MD     Controlled Substance Prescriptions [place] Controlled Substance Registry consulted? Not Applicable   Eustace MooreNelson, Spruha Weight Sue, MD 03/15/18 2044

## 2018-05-12 ENCOUNTER — Encounter (HOSPITAL_COMMUNITY): Payer: Self-pay

## 2018-05-12 ENCOUNTER — Ambulatory Visit (HOSPITAL_COMMUNITY)
Admission: EM | Admit: 2018-05-12 | Discharge: 2018-05-12 | Disposition: A | Discharge status: Home / Self Care | Payer: BLUE CROSS/BLUE SHIELD | Attending: Family Medicine | Admitting: Family Medicine

## 2018-05-12 ENCOUNTER — Other Ambulatory Visit: Payer: Self-pay

## 2018-05-12 DIAGNOSIS — Z760 Encounter for issue of repeat prescription: Secondary | ICD-10-CM

## 2018-05-12 DIAGNOSIS — Z8709 Personal history of other diseases of the respiratory system: Secondary | ICD-10-CM

## 2018-05-12 DIAGNOSIS — Z029 Encounter for administrative examinations, unspecified: Secondary | ICD-10-CM | POA: Diagnosis not present

## 2018-05-12 DIAGNOSIS — J029 Acute pharyngitis, unspecified: Secondary | ICD-10-CM

## 2018-05-12 MED ORDER — HYDROXYZINE HCL 25 MG PO TABS: 25.0000 mg | ORAL_TABLET | Freq: Four times a day (QID) | ORAL | 1 refills | Status: DC | PRN

## 2018-05-12 NOTE — ED Triage Notes (Signed)
Pt presents to get a letter to be cleared to go back to work after having a sore throat for a few days; pt states his throat is better and he is not having any other symptoms.

## 2018-05-12 NOTE — ED Provider Notes (Signed)
[facility]    CSN: [id number] Arrival date & time: 05/12/18  1705     History   Chief Complaint Chief Complaint  Patient presents with  . Medical Letter Back To Work    HPI Jacob Wolf is a 23 y.o. male.   HPI  Patient was out sick for 2 days with a sore throat.  He tried to go back to work today and they will not allow him to come back without a work note.  He states he still has a mild sore throat but it is much improved.  No runny stuffy nose.  No coughing or chest congestion.  He does not think he had a fever.  He is otherwise healthy. He also requests a refill of hydroxyzine that he uses for anxiety. He is reminded that he needs to go see a PCP for medicine refills  Past Medical History:  Diagnosis Date  . Asthma   . Asthma, mild intermittent 10/29/2011  . Concussion     Patient Active Problem List   Diagnosis Date Noted  . Severe major depression, single episode (HCC) 12/05/2014  . 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 Asthma, mild intermittent 10/29/2011    Past Surgical History:  Procedure Laterality Date  . APPENDECTOMY    . LAPAROSCOPIC APPENDECTOMY  04/16/2011   Procedure: APPENDECTOMY LAPAROSCOPIC;  Surgeon: Judie Petit. Leonia Corona, MD;  Location: [facility];  Service: Pediatrics;  Laterality: N/A;  . Lt toe removal         Home Medications    Prior to Admission medications   Medication Sig Start Date End Date Taking? Authorizing Provider  citalopram (CELEXA) 20 MG tablet Take 1 tablet (20 mg total) by mouth daily. 03/15/18   Eustace Moore, MD  hydrOXYzine (ATARAX/VISTARIL) 25 MG tablet Take 1 tablet (25 mg total) by mouth every 6 (six) hours as needed for anxiety. 05/12/18   Eustace Moore, MD  traZODone (DESYREL) 50 MG tablet Take 1 tablet (50 mg total) by mouth at bedtime as needed for sleep. 03/15/18   Eustace Moore, MD    Family History History reviewed. No pertinent family  history.  Social History Social History   Tobacco Use  . Smoking status: Never Smoker  . Smokeless tobacco: Never Used  Substance Use Topics  . Alcohol use: No  . Drug use: No     Allergies   Patient has no known allergies.   Review of Systems Review of Systems  Constitutional: Negative for chills and fever.  HENT: Positive for sore throat. Negative for ear pain.   Eyes: Negative for pain and visual disturbance.  Respiratory: Negative for cough and shortness of breath.   Cardiovascular: Negative for chest pain and palpitations.  Gastrointestinal: Negative for abdominal pain and vomiting.  Genitourinary: Negative for dysuria and hematuria.  Musculoskeletal: Negative for arthralgias and back pain.  Skin: Negative for color change and rash.  Neurological: Negative for seizures and syncope.  All other systems reviewed and are negative.    Physical Exam Triage Vital Signs ED Triage Vitals  Enc Vitals Group     BP 05/12/18 1728 121/70     Pulse Rate 05/12/18 1728 96     Resp 05/12/18 1728 20     Temp 05/12/18 1728 98.2 F (36.8 C)     Temp Source 05/12/18 1728 Oral     SpO2 05/12/18 1728 98 %     Weight --  Height --      Head Circumference --      Peak Flow --      Pain Score 05/12/18 1726 0     Pain Loc --      Pain Edu? --      Excl. in GC? --    No data found.  Updated Vital Signs BP 121/70 (BP Location: Right Arm)   Pulse 96   Temp 98.2 F (36.8 C) (Oral)   Resp 20   SpO2 98%      Physical Exam Constitutional:      General: He is not in acute distress.    Appearance: He is well-developed.  HENT:     Head: Normocephalic and atraumatic.     Mouth/Throat:     Pharynx: Posterior oropharyngeal erythema present.     Comments: Mild erythema.  No exudate. Eyes:     Conjunctiva/sclera: Conjunctivae normal.     Pupils: Pupils are equal, round, and reactive to light.  Neck:     Musculoskeletal: Normal range of motion.  Cardiovascular:     Rate  and Rhythm: Normal rate.  Pulmonary:     Effort: Pulmonary effort is normal. No respiratory distress.  Abdominal:     General: There is no distension.     Palpations: Abdomen is soft.  Musculoskeletal: Normal range of motion.  Lymphadenopathy:     Cervical: Cervical adenopathy present.  Skin:    General: Skin is warm and dry.  Neurological:     Mental Status: He is alert.      UC Treatments / Results  Labs (all labs ordered are listed, but only abnormal results are displayed) Labs Reviewed - No data to display  EKG None  Radiology No results found.  Procedures Procedures (including critical care time)  Medications Ordered in UC Medications - No data to display  Initial Impression / Assessment and Plan / UC Course  I have reviewed the triage vital signs and the nursing notes.  Pertinent labs & imaging results that were available during my care of the patient were reviewed by me and considered in my medical decision making (see chart for details).      Final Clinical Impressions(s) / UC Diagnoses   Final diagnoses:  History of sore throat     Discharge Instructions     Call and set up an appointment with primary care for additional refills   ED Prescriptions    Medication Sig Dispense Auth. Provider   hydrOXYzine (ATARAX/VISTARIL) 25 MG tablet Take 1 tablet (25 mg total) by mouth every 6 (six) hours as needed for anxiety. 30 tablet Eustace Moore, MD     Controlled Substance Prescriptions [place] Controlled Substance Registry consulted? Not Applicable   Eustace Moore, MD 05/12/18 2050

## 2018-05-12 NOTE — Discharge Instructions (Addendum)
Call and set up an appointment with primary care for additional refills

## 2018-05-20 ENCOUNTER — Ambulatory Visit (HOSPITAL_COMMUNITY)
Admission: EM | Admit: 2018-05-20 | Discharge: 2018-05-20 | Disposition: A | Discharge status: Home / Self Care | Payer: BLUE CROSS/BLUE SHIELD | Attending: Family Medicine | Admitting: Family Medicine

## 2018-05-20 ENCOUNTER — Encounter (HOSPITAL_COMMUNITY): Payer: Self-pay | Admitting: Emergency Medicine

## 2018-05-20 ENCOUNTER — Other Ambulatory Visit: Payer: Self-pay

## 2018-05-20 DIAGNOSIS — Z0289 Encounter for other administrative examinations: Secondary | ICD-10-CM

## 2018-05-20 DIAGNOSIS — K30 Functional dyspepsia: Secondary | ICD-10-CM

## 2018-05-20 NOTE — ED Triage Notes (Signed)
Pt called out of work yesterday for a stomach ache.  Pt is having to get a work note to return to work.  Pt states he feels better today with no cough, SOB or fever.

## 2018-05-28 NOTE — ED Provider Notes (Signed)
[facility]   [id number] 05/20/18 Arrival Time: 1649  ASSESSMENT & PLAN:  1. Upset stomach    Symptoms have resolved. Work note provided.  Reviewed expectations re: course of current medical issues. Questions answered. Outlined signs and symptoms indicating need for more acute intervention. Patient verbalized understanding. After Visit Summary given.   SUBJECTIVE: History from: patient.  Jacob Wolf is a 23 y.o. male who reports having "a stomach ache" yesterday. "Think I've been constipated." BM relieved symptoms. He did call out of work and needs a note to return to work. Afebrile. No n/v. No current abdominal pains. Appetite: normal. PO intake: normal. Ambulatory without assistance. Urinary symptoms: none. Sick contacts: none. Recent travel or camping: none. OTC treatment: none.  Past Surgical History:  Procedure Laterality Date  . APPENDECTOMY    . LAPAROSCOPIC APPENDECTOMY  04/16/2011   Procedure: APPENDECTOMY LAPAROSCOPIC;  Surgeon: Judie Petit. Leonia Corona, MD;  Location: [facility];  Service: Pediatrics;  Laterality: N/A;  . Lt toe removal     ROS: As per HPI.  OBJECTIVE:  Vitals:   05/20/18 1713  BP: 119/73  Pulse: 94  Resp: 12  Temp: 98.3 F (36.8 C)  TempSrc: Oral  SpO2: 99%    General appearance: alert; no distress Oropharynx: moist Lungs: clear to auscultation bilaterally; unlabored Heart: regular rate and rhythm Abdomen: soft; non-distended; no tenderness Back: no CVA tenderness Extremities: no edema; symmetrical with no gross deformities Skin: warm; dry Neurologic: normal gait Psychological: alert and cooperative; normal mood and affect  No Known Allergies                                             Past Medical History:  Diagnosis Date  . Asthma   . Asthma, mild intermittent 10/29/2011  . Concussion    Social History   Socioeconomic History  . Marital status: Single    Spouse name: Not on file  . Number of children: Not on file   . Years of education: Not on file  . Highest education level: Not on file  Occupational History  . Not on file  Social Needs  . Financial resource strain: Not on file  . Food insecurity:    Worry: Not on file    Inability: Not on file  . Transportation needs:    Medical: Not on file    Non-medical: Not on file  Tobacco Use  . Smoking status: Never Smoker  . Smokeless tobacco: Never Used  Substance and Sexual Activity  . Alcohol use: No  . Drug use: No  . Sexual activity: Never  Lifestyle  . Physical activity:    Days per week: Not on file    Minutes per session: Not on file  . Stress: Not on file  Relationships  . Social connections:    Talks on phone: Not on file    Gets together: Not on file    Attends religious service: Not on file    Active member of club or organization: Not on file    Attends meetings of clubs or organizations: Not on file    Relationship status: Not on file  . Intimate partner violence:    Fear of current or ex partner: Not on file    Emotionally abused: Not on file    Physically abused: Not on file    Forced sexual activity: Not on  file  Other Topics Concern  . Not on file  Social History Narrative   [employer]   10 th grade.         History reviewed. No pertinent family history.   Mardella Layman, MD 05/28/18 1000

## 2019-06-14 ENCOUNTER — Emergency Department (HOSPITAL_COMMUNITY)
Admission: EM | Admit: 2019-06-14 | Discharge: 2019-06-15 | Disposition: A | Discharge status: Home / Self Care | Payer: BLUE CROSS/BLUE SHIELD | Attending: Emergency Medicine | Admitting: Emergency Medicine

## 2019-06-14 ENCOUNTER — Encounter (HOSPITAL_COMMUNITY): Payer: Self-pay | Admitting: Emergency Medicine

## 2019-06-14 ENCOUNTER — Other Ambulatory Visit: Payer: Self-pay

## 2019-06-14 ENCOUNTER — Emergency Department (HOSPITAL_COMMUNITY): Payer: BLUE CROSS/BLUE SHIELD

## 2019-06-14 DIAGNOSIS — Z5321 Procedure and treatment not carried out due to patient leaving prior to being seen by health care provider: Secondary | ICD-10-CM | POA: Insufficient documentation

## 2019-06-14 DIAGNOSIS — R0789 Other chest pain: Secondary | ICD-10-CM | POA: Diagnosis not present

## 2019-06-14 LAB — BASIC METABOLIC PANEL
Anion gap: 13 (ref 5–15)
BUN: 9 mg/dL (ref 6–20)
CO2: 29 mmol/L (ref 22–32)
Calcium: 9.3 mg/dL (ref 8.9–10.3)
Chloride: 99 mmol/L (ref 98–111)
Creatinine, Ser: 0.98 mg/dL (ref 0.61–1.24)
GFR calc Af Amer: >60 mL/min (ref >60)
GFR calc non Af Amer: >60 mL/min (ref >60)
Glucose, Bld: 94 mg/dL (ref 70–99)
Potassium: 3.5 mmol/L (ref 3.5–5.1)
Sodium: 141 mmol/L (ref 135–145)

## 2019-06-14 LAB — CBC
HCT: 49.7 % (ref 39.0–52.0)
Hemoglobin: 16.7 g/dL (ref 13.0–17.0)
MCH: 30.2 pg (ref 26.0–34.0)
MCHC: 33.6 g/dL (ref 30.0–36.0)
MCV: 89.9 fL (ref 80.0–100.0)
Platelets: 217 10*3/uL (ref 150–400)
RBC: 5.53 MIL/uL (ref 4.22–5.81)
RDW: 11.8 % (ref 11.5–15.5)
WBC: 5.3 10*3/uL (ref 4.0–10.5)
nRBC: 0 % (ref 0.0–0.2)

## 2019-06-14 LAB — TROPONIN I (HIGH SENSITIVITY): Troponin I (High Sensitivity): <2 ng/L (ref <18)

## 2019-06-14 MED ORDER — SODIUM CHLORIDE 0.9% FLUSH: 3.0000 mL | Freq: Once | INTRAVENOUS | Status: DC

## 2019-06-14 NOTE — ED Triage Notes (Signed)
Pt reports left sided chest "stinging."  States it hurts more when it moves certain ways.  Denies any other symptoms.

## 2019-06-14 NOTE — ED Notes (Signed)
Pt left waiting room

## 2019-06-15 ENCOUNTER — Emergency Department (HOSPITAL_COMMUNITY): Payer: BLUE CROSS/BLUE SHIELD

## 2019-06-15 ENCOUNTER — Encounter (HOSPITAL_COMMUNITY): Payer: Self-pay | Admitting: *Deleted

## 2019-06-15 ENCOUNTER — Emergency Department (HOSPITAL_COMMUNITY)
Admission: EM | Admit: 2019-06-15 | Discharge: 2019-06-15 | Discharge status: Against Medical Advice | Payer: BLUE CROSS/BLUE SHIELD | Source: Home / Self Care

## 2019-06-15 NOTE — ED Triage Notes (Signed)
Pt states he is having left sided chest discomfort with movement. "Feels like it is beating hard at times"

## 2019-06-15 NOTE — ED Notes (Signed)
Pt left waiting room

## 2021-02-26 ENCOUNTER — Emergency Department (HOSPITAL_COMMUNITY)
Admission: EM | Admit: 2021-02-26 | Discharge: 2021-02-27 | Disposition: A | Discharge status: Home / Self Care | Payer: BLUE CROSS/BLUE SHIELD | Attending: Emergency Medicine | Admitting: Emergency Medicine

## 2021-02-26 ENCOUNTER — Encounter (HOSPITAL_COMMUNITY): Payer: Self-pay | Admitting: Emergency Medicine

## 2021-02-26 ENCOUNTER — Other Ambulatory Visit: Payer: Self-pay

## 2021-02-26 ENCOUNTER — Emergency Department (HOSPITAL_COMMUNITY): Payer: BLUE CROSS/BLUE SHIELD

## 2021-02-26 DIAGNOSIS — S0993XA Unspecified injury of face, initial encounter: Secondary | ICD-10-CM | POA: Diagnosis present

## 2021-02-26 DIAGNOSIS — M79641 Pain in right hand: Secondary | ICD-10-CM | POA: Diagnosis not present

## 2021-02-26 DIAGNOSIS — Y9372 Activity, wrestling: Secondary | ICD-10-CM | POA: Diagnosis not present

## 2021-02-26 DIAGNOSIS — S01111A Laceration without foreign body of right eyelid and periocular area, initial encounter: Secondary | ICD-10-CM | POA: Insufficient documentation

## 2021-02-26 DIAGNOSIS — W1839XA Other fall on same level, initial encounter: Secondary | ICD-10-CM | POA: Insufficient documentation

## 2021-02-26 NOTE — ED Provider Notes (Signed)
Emergency Medicine Provider Triage Evaluation Note  Jacob Wolf , a 26 y.o. male  was evaluated in triage.  Pt complains of laceration over right eyebrow.  He states that last night at about 10 PM he was wrestling.  He reports unknown last tetanus. He also has pain in his right fifth MCP joint and distal right fifth metacarpal.  He is able to make a fist however reports it is painful.  Review of Systems  Positive: Laceration on right eyebrow, right hand pain Negative: Wound on right hand, syncope, headache  Physical Exam  BP 140/74    Pulse 73    Temp 98.5 F (36.9 C) (Oral)    Resp 17    Ht 5\' 8"  (1.727 m)    Wt 82 kg    SpO2 99%    BMI 27.49 kg/m  Gen:   Awake, no distress   Resp:  Normal effort  MSK:   Moves extremities without difficulty, there is tenderness to palpation over the distal right fifth metacarpal with limited range of motion secondary to pain.  Mild edema.  No wounds in this area visualized. Other:  Superficial laceration over the lateral aspect of the right eyebrow.  No active bleeding.  Medical Decision Making  Medically screening exam initiated at 10:54 PM.  Appropriate orders placed.  C Maberry was informed that the remainder of the evaluation will be completed by another provider, this initial triage assessment does not replace that evaluation, and the importance of remaining in the ED until their evaluation is complete.  Patient reports he was wrestling 24 hours ago.  Denies concern for fight bite type injury, unknown last tetanus.  Small superficial laceration over the lateral right eyebrow without active bleeding.  Pain over the right fifth metacarpal distally.  Hand x-rays ordered.   [place], PA-C 02/26/21 2258    04/26/21, DO 02/26/21 2259

## 2021-02-26 NOTE — ED Triage Notes (Signed)
Patient presents with skin laceration approx.1/2 " at right lateral eyebrow sustained yesterday during a wrestling sport , also stated pain at right 5th knuckle . Denies LOC/ambulatory .

## 2021-02-27 MED ORDER — IBUPROFEN 600 MG PO TABS: 600.0000 mg | ORAL_TABLET | Freq: Four times a day (QID) | ORAL | 0 refills | Status: DC | PRN

## 2021-02-27 MED ORDER — CEPHALEXIN 500 MG PO CAPS: 500.0000 mg | ORAL_CAPSULE | Freq: Four times a day (QID) | ORAL | 0 refills | Status: DC

## 2021-02-27 NOTE — Discharge Instructions (Signed)
Seen here today for evaluation of a laceration to your right eyebrow and right hand pain.  Unfortunately, since your laceration was obtained days ago, it is not a good candidate for suturing.  You will have to let it heal on its own.  You can still keep the wound clean with soap and water.  Your x-ray was negative for any signs of fracture or break, just showed some soft tissue swelling.  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  Additionally, I prescribed you ibuprofen to take every 6 hours as needed for pain.  If you have any change in color, numbness, tingling, increased swelling, weakness, please return to the nearest emergency department for evaluation.

## 2021-02-27 NOTE — ED Provider Notes (Signed)
[facility] EMERGENCY DEPARTMENT Provider Note   CSN: [id number] Arrival date & time: 02/26/21  2212     History  Chief Complaint  Patient presents with   Laceration: eyebrow    Jacob Wolf is a 26 y.o. male presents to the ED for evaluation of right eyebrow laceration and right 5th MCP swelling and pain since Sunday after a fall in wrestling.He denies any LOC, head pain/ache, neck pain, or back pain. He denies any visual changes or photophobia. No pertinent medical or surgical history. Denies any daily medications. NKDA. Denies any tobacco use.   HPI     Home Medications Prior to Admission medications   Medication Sig Start Date End Date Taking? Authorizing Provider  cephALEXin (KEFLEX) 500 MG capsule Take 1 capsule (500 mg total) by mouth 4 (four) times daily. 02/27/21  Yes Sherrell Puller, PA-C  ibuprofen (ADVIL) 600 MG tablet Take 1 tablet (600 mg total) by mouth every 6 (six) hours as needed. 02/27/21  Yes Sherrell Puller, PA-C  citalopram (CELEXA) 20 MG tablet Take 1 tablet (20 mg total) by mouth daily. 03/15/18   Raylene Everts, MD  hydrOXYzine (ATARAX/VISTARIL) 25 MG tablet Take 1 tablet (25 mg total) by mouth every 6 (six) hours as needed for anxiety. 05/12/18   Raylene Everts, MD  traZODone (DESYREL) 50 MG tablet Take 1 tablet (50 mg total) by mouth at bedtime as needed for sleep. 03/15/18   Raylene Everts, MD      Allergies    Patient has no known allergies.    Review of Systems   Review of Systems  Constitutional:  Negative for chills and fever.  HENT:  Negative for ear pain and sore throat.   Eyes:  Negative for photophobia, pain, discharge, redness and visual disturbance.  Respiratory:  Negative for cough and shortness of breath.   Cardiovascular:  Negative for chest pain and palpitations.  Gastrointestinal:  Negative for abdominal pain and vomiting.  Genitourinary:  Negative for dysuria and hematuria.  Musculoskeletal:  Positive for  arthralgias. Negative for back pain and myalgias.  Skin:  Positive for wound. Negative for color change and rash.  Neurological:  Negative for seizures and syncope.  All other systems reviewed and are negative.  Physical Exam Updated Vital Signs BP (!) 127/92 (BP Location: Right Arm)    Pulse 78    Temp 98.5 F (36.9 C)    Resp 20    Ht 5\' 8"  (1.727 m)    Wt 82 kg    SpO2 95%    BMI 27.49 kg/m  Physical Exam Vitals and nursing note reviewed.  Constitutional:      Appearance: Normal appearance.  Eyes:     General: No scleral icterus. Pulmonary:     Effort: Pulmonary effort is normal. No respiratory distress.  Musculoskeletal:        General: Swelling and tenderness present. No deformity. Normal range of motion.     Comments: FROM of right hand. Erythema with mild swelling to the 5th MCP on the right. No overlying abrasion or lacerations noted. Cap refill < 3 seconds. Sensation intact.   Skin:    General: Skin is dry.     Findings: No rash.     Comments: Approximately 1cm laceration above the right eyebrow. Small amount of surrounding swelling. Scab in place with no active bleeding or discharge. No surrounding erythema or warmth.  Neurological:     General: No focal deficit present.  Mental Status: He is alert. Mental status is at baseline.  Psychiatric:        Mood and Affect: Mood normal.    ED Results / Procedures / Treatments   Labs (all labs ordered are listed, but only abnormal results are displayed) Labs Reviewed - No data to display  EKG None  Radiology DG Hand Complete Right  Result Date: 02/26/2021 CLINICAL DATA:  Pain over distal fifth metacarpal phalangeal joint. EXAM: RIGHT HAND - COMPLETE 3+ VIEW COMPARISON:  07/06/2018. FINDINGS: There is no evidence of fracture or dislocation. There is no evidence of arthropathy or other focal bone abnormality. Mild soft tissue swelling is present at the fifth metacarpal phalangeal joint. IMPRESSION: No acute fracture or  dislocation. Electronically Signed   By: Brett Fairy M.D.   On: 02/26/2021 23:24    Procedures Procedures   Medications Ordered in ED Medications - No data to display  ED Course/ Medical Decision Making/ A&P                           Medical Decision Making  26 y/o M presents to the ED for eval of right eyebrow laceration and right 5th MCP pain. Vital signs are stable. On physical exam, the patient has laceration in the beginning stages of healing. Because of the time of the incident and now, the patient is not a candidate for suture repair. Will have to heal by secondary intention.   Swelling and some erythema noted tot he right 5th MCP. FROM present. Concern for cellulitis vs gout vs septic joint vs strain/sprain given benign XR. Attending assessed at bedside and recommended Keflex for cellulitis as well as a removable splint for comfort.   Splint ordered placed. Keflex and ibuprofen prescription sent into pharmacy. Strict return precautions discussed with the patient. Patient agrees to plan. The patient is stable and being discharged home in good condition.  I discussed this case with my attending physician who cosigned this note including patient's presenting symptoms, physical exam, and planned diagnostics and interventions. Attending physician stated agreement with plan or made changes to plan which were implemented.   Attending physician assessed patient at bedside.  Final Clinical Impression(s) / ED Diagnoses Final diagnoses:  Pain of right hand  Laceration of right eyebrow, initial encounter    Rx / DC Orders ED Discharge Orders          Ordered    cephALEXin (KEFLEX) 500 MG capsule  4 times daily        02/27/21 1136    ibuprofen (ADVIL) 600 MG tablet  Every 6 hours PRN        02/27/21 1136              Sherrell Puller, PA-C 02/28/21 2119    Godfrey Pick, MD 03/02/21 1749

## 2022-01-16 ENCOUNTER — Ambulatory Visit (HOSPITAL_COMMUNITY)
Admission: EM | Admit: 2022-01-16 | Discharge: 2022-01-16 | Disposition: A | Discharge status: Home / Self Care | Payer: BLUE CROSS/BLUE SHIELD | Attending: Internal Medicine | Admitting: Internal Medicine

## 2022-01-16 ENCOUNTER — Ambulatory Visit (INDEPENDENT_AMBULATORY_CARE_PROVIDER_SITE_OTHER): Payer: BLUE CROSS/BLUE SHIELD

## 2022-01-16 ENCOUNTER — Encounter (HOSPITAL_COMMUNITY): Payer: Self-pay

## 2022-01-16 DIAGNOSIS — M25562 Pain in left knee: Secondary | ICD-10-CM | POA: Diagnosis not present

## 2022-01-16 DIAGNOSIS — S83411A Sprain of medial collateral ligament of right knee, initial encounter: Secondary | ICD-10-CM

## 2022-01-16 NOTE — ED Triage Notes (Signed)
Pt injured left knee last night causing pain and discomfort since lat night

## 2022-01-16 NOTE — ED Provider Notes (Signed)
[facility]   [id number] 01/16/22 Arrival Time: 1101  ASSESSMENT & PLAN:  1. Sprain of medial collateral ligament of right knee, initial encounter    -X-rays negative for fracture.  I am worried about the integrity of some of the ligaments, namely the MCL and ACL.  We will treat this symptomatically for now by placing him in a hinged knee brace and giving him crutches for assistance with ambulation.  Advised him on RICE and taking over-the-counter ibuprofen as needed.  Ultimately I think he will need to follow-up with an orthopedist and might have an MRI in his future.  He given referral to [facility] orthopedics.  He will call for an appointment next week.  All questions were answered and he agrees to plan.  No orders of the defined types were placed in this encounter.  Discharge Instructions   None     Follow-up Information     Schedule an appointment as soon as possible for a visit  with Sheral Apley, MD.   Specialty: Orthopedic Surgery Contact information: [address] [place] [address] [phone]                  Reviewed expectations re: course of current medical issues. Questions answered. Outlined signs and symptoms indicating need for more acute intervention. Patient verbalized understanding. After Visit Summary given.   SUBJECTIVE: Really pleasant 26 year old male comes urgent care to be evaluated for left knee pain.  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  He immediately felt pain on the inside part of the knee.  He has noticed a little bit of swelling.  He has taken some Tylenol and ibuprofen but not gotten any relief.  He has had a hard time walking but is able to weight-bear.  Denies any prior injury to the left knee.  Denies any numbness or tingling.  No LMP for male patient. Past Surgical History:  Procedure Laterality Date    APPENDECTOMY     LAPAROSCOPIC APPENDECTOMY  04/16/2011   Procedure: APPENDECTOMY LAPAROSCOPIC;  Surgeon: Judie Petit. Leonia Corona, MD;  Location: [facility] OR;  Service: Pediatrics;  Laterality: N/A;   Lt toe removal       OBJECTIVE:  Vitals:   01/16/22 1119  BP: 124/66  Pulse: 76  Resp: 12  Temp: 98 F (36.7 C)  TempSrc: Oral  SpO2: 98%     Physical Exam Vitals reviewed.  Constitutional:      Appearance: Normal appearance. He is not ill-appearing.  Cardiovascular:     Rate and Rhythm: Normal rate.  Pulmonary:     Effort: Pulmonary effort is normal.  Musculoskeletal:     Comments: L Knee -small effusion.  Tender to palpation medial joint line.  Nontender palpation over lateral joint line or patella.  Negative patellar apprehension.  Range of motion only 10-90.  He has pain at the medial joint line with valgus stress.  No pain with varus stress.  His Lachman on the left side does feel more lax compared to the contralateral side.  Neurological:     General: No focal deficit present.  Psychiatric:        Mood and Affect: Mood normal.      Labs: Results for orders placed or performed during the hospital encounter of 06/14/19  Basic metabolic panel  Result Value Ref Range   Sodium 141 135 - 145  mmol/L   Potassium 3.5 3.5 - 5.1 mmol/L   Chloride 99 98 - 111 mmol/L   CO2 29 22 - 32 mmol/L   Glucose, Bld 94 70 - 99 mg/dL   BUN 9 6 - 20 mg/dL   Creatinine, Ser 2.63 0.61 - 1.24 mg/dL   Calcium 9.3 8.9 - 33.5 mg/dL   GFR calc non Af Amer >60 >60 mL/min   GFR calc Af Amer >60 >60 mL/min   Anion gap 13 5 - 15  CBC  Result Value Ref Range   WBC 5.3 4.0 - 10.5 K/uL   RBC 5.53 4.22 - 5.81 MIL/uL   Hemoglobin 16.7 13.0 - 17.0 g/dL   HCT 45.6 25.6 - 38.9 %   MCV 89.9 80.0 - 100.0 fL   MCH 30.2 26.0 - 34.0 pg   MCHC 33.6 30.0 - 36.0 g/dL   RDW 37.3 42.8 - 76.8 %   Platelets 217 150 - 400 K/uL   nRBC 0.0 0.0 - 0.2 %  Troponin I (High Sensitivity)  Result Value Ref Range   Troponin I  (High Sensitivity) <2 <18 ng/L   Labs Reviewed - No data to display  Imaging: DG Knee Complete 4 Views Left  Result Date: 01/16/2022 CLINICAL DATA:  Left knee pain after injury yesterday. EXAM: LEFT KNEE - COMPLETE 4+ VIEW COMPARISON:  None Available. FINDINGS: No evidence of fracture, dislocation, or joint effusion. No evidence of arthropathy or other focal bone abnormality. Soft tissues are unremarkable. IMPRESSION: Negative. Electronically Signed   By: Lupita Raider M.D.   On: 01/16/2022 12:10     No Known Allergies                                             Past Medical History:  Diagnosis Date   Asthma    Asthma, mild intermittent 10/29/2011   Concussion     Social History   Socioeconomic History   Marital status: Single    Spouse name: Not on file   Number of children: Not on file   Years of education: Not on file   Highest education level: Not on file  Occupational History   Not on file  Tobacco Use   Smoking status: Never   Smokeless tobacco: Never  Substance and Sexual Activity   Alcohol use: No   Drug use: No   Sexual activity: Never  Other Topics Concern   Not on file  Social History Narrative   [employer]   10 th grade.         Social Determinants of Health   Financial Resource Strain: Not on file  Food Insecurity: Not on file  Transportation Needs: Not on file  Physical Activity: Not on file  Stress: Not on file  Social Connections: Not on file  Intimate Partner Violence: Not on file    History reviewed. No pertinent family history.    Eston Heslin, Baldemar Friday, MD 01/16/22 1312

## 2022-02-04 ENCOUNTER — Emergency Department (HOSPITAL_COMMUNITY)
Admission: EM | Admit: 2022-02-04 | Discharge: 2022-02-04 | Discharge status: Against Medical Advice | Payer: BLUE CROSS/BLUE SHIELD | Attending: Medical | Admitting: Medical

## 2022-02-04 ENCOUNTER — Emergency Department (HOSPITAL_COMMUNITY): Payer: BLUE CROSS/BLUE SHIELD

## 2022-02-04 ENCOUNTER — Encounter (HOSPITAL_COMMUNITY): Payer: Self-pay | Admitting: Emergency Medicine

## 2022-02-04 DIAGNOSIS — Z5321 Procedure and treatment not carried out due to patient leaving prior to being seen by health care provider: Secondary | ICD-10-CM | POA: Insufficient documentation

## 2022-02-04 DIAGNOSIS — M25562 Pain in left knee: Secondary | ICD-10-CM | POA: Insufficient documentation

## 2022-02-04 MED ORDER — IBUPROFEN 400 MG PO TABS: 600.0000 mg | ORAL_TABLET | Freq: Once | ORAL | Status: DC

## 2022-02-04 NOTE — ED Provider Triage Note (Addendum)
Emergency Medicine Provider Triage Evaluation Note  Jacob Wolf , a 26 y.o. male  was evaluated in triage.  Pt complains of left knee pain since last Wednesday after doing wrestling move and landing on knee. Some difficulty placing weight. More pain w/bending of knee and moving left and right.  Review of Systems  Positive: R knee pain Negative: rash  Physical Exam  There were no vitals taken for this visit. Gen:   Awake, no distress   Resp:  Normal effort  MSK:   Moves extremities without difficulty  Other:    Medical Decision Making  Medically screening exam initiated at 7:23 PM.  Appropriate orders placed.  Jacob C Sobolewski was informed that the remainder of the evaluation will be completed by another provider, this initial triage assessment does not replace that evaluation, and the importance of remaining in the ED until their evaluation is complete.     Pete Pelt, PA 02/04/22 1925    Pete Pelt, [place] 02/15/22 1416

## 2022-02-04 NOTE — ED Triage Notes (Signed)
Pt has knee pain in left knee. He was seen at [facility] and they did xray. He is requesting MRI. Worried about MCL. He jumped off something and landed on knee. He has brace on. Denies bruising and swelling.

## 2022-03-01 ENCOUNTER — Ambulatory Visit: Payer: BLUE CROSS/BLUE SHIELD | Admitting: Family Medicine

## 2022-03-21 ENCOUNTER — Ambulatory Visit (HOSPITAL_COMMUNITY)
Admission: EM | Admit: 2022-03-21 | Discharge: 2022-03-22 | Disposition: A | Discharge status: Other Acute Inpatient Hospital | Payer: BLUE CROSS/BLUE SHIELD | Attending: Nurse Practitioner | Admitting: Nurse Practitioner

## 2022-03-21 ENCOUNTER — Other Ambulatory Visit: Payer: Self-pay

## 2022-03-21 DIAGNOSIS — Z9151 Personal history of suicidal behavior: Secondary | ICD-10-CM | POA: Insufficient documentation

## 2022-03-21 DIAGNOSIS — Z79899 Other long term (current) drug therapy: Secondary | ICD-10-CM | POA: Diagnosis not present

## 2022-03-21 DIAGNOSIS — F419 Anxiety disorder, unspecified: Secondary | ICD-10-CM | POA: Diagnosis not present

## 2022-03-21 DIAGNOSIS — T71162A Asphyxiation due to hanging, intentional self-harm, initial encounter: Secondary | ICD-10-CM

## 2022-03-21 DIAGNOSIS — F332 Major depressive disorder, recurrent severe without psychotic features: Secondary | ICD-10-CM | POA: Diagnosis present

## 2022-03-21 DIAGNOSIS — Z1152 Encounter for screening for COVID-19: Secondary | ICD-10-CM

## 2022-03-21 DIAGNOSIS — R45851 Suicidal ideations: Secondary | ICD-10-CM | POA: Diagnosis not present

## 2022-03-21 LAB — RESP PANEL BY RT-PCR (RSV, FLU A&B, COVID)  RVPGX2
Influenza A by PCR: NEGATIVE
Influenza B by PCR: NEGATIVE
Resp Syncytial Virus by PCR: NEGATIVE
SARS Coronavirus 2 by RT PCR: NEGATIVE

## 2022-03-21 LAB — POCT URINE DRUG SCREEN - MANUAL ENTRY (I-SCREEN)
POC Amphetamine UR: NOT DETECTED
POC Buprenorphine (BUP): NOT DETECTED
POC Cocaine UR: NOT DETECTED
POC Marijuana UR: NOT DETECTED
POC Methadone UR: NOT DETECTED
POC Methamphetamine UR: NOT DETECTED
POC Morphine: NOT DETECTED
POC Oxazepam (BZO): NOT DETECTED
POC Oxycodone UR: NOT DETECTED
POC Secobarbital (BAR): NOT DETECTED

## 2022-03-21 LAB — CBC WITH DIFFERENTIAL/PLATELET
Abs Immature Granulocytes: 0.02 10*3/uL (ref 0.00–0.07)
Basophils Absolute: 0 10*3/uL (ref 0.0–0.1)
Basophils Relative: 0 %
Eosinophils Absolute: 0 10*3/uL (ref 0.0–0.5)
Eosinophils Relative: 0 %
HCT: 48.2 % (ref 39.0–52.0)
Hemoglobin: 16.8 g/dL (ref 13.0–17.0)
Immature Granulocytes: 0 %
Lymphocytes Relative: 33 %
Lymphs Abs: 1.9 10*3/uL (ref 0.7–4.0)
MCH: 30.4 pg (ref 26.0–34.0)
MCHC: 34.9 g/dL (ref 30.0–36.0)
MCV: 87.3 fL (ref 80.0–100.0)
Monocytes Absolute: 0.4 10*3/uL (ref 0.1–1.0)
Monocytes Relative: 7 %
Neutro Abs: 3.4 10*3/uL (ref 1.7–7.7)
Neutrophils Relative %: 60 %
Platelets: 312 10*3/uL (ref 150–400)
RBC: 5.52 MIL/uL (ref 4.22–5.81)
RDW: 12.3 % (ref 11.5–15.5)
WBC: 5.7 10*3/uL (ref 4.0–10.5)
nRBC: 0 % (ref 0.0–0.2)

## 2022-03-21 LAB — COMPREHENSIVE METABOLIC PANEL
ALT: 32 U/L (ref 0–44)
AST: 24 U/L (ref 15–41)
Albumin: 4.4 g/dL (ref 3.5–5.0)
Alkaline Phosphatase: 54 U/L (ref 38–126)
Anion gap: 10 (ref 5–15)
BUN: 9 mg/dL (ref 6–20)
CO2: 28 mmol/L (ref 22–32)
Calcium: 9.6 mg/dL (ref 8.9–10.3)
Chloride: 101 mmol/L (ref 98–111)
Creatinine, Ser: 0.95 mg/dL (ref 0.61–1.24)
GFR, Estimated: >60 mL/min (ref >60)
Glucose, Bld: 104 mg/dL — ABNORMAL HIGH (ref 70–99)
Potassium: 4 mmol/L (ref 3.5–5.1)
Sodium: 139 mmol/L (ref 135–145)
Total Bilirubin: 0.8 mg/dL (ref 0.3–1.2)
Total Protein: 7.6 g/dL (ref 6.5–8.1)

## 2022-03-21 LAB — LIPID PANEL
Cholesterol: 190 mg/dL (ref 0–200)
HDL: 48 mg/dL (ref >40)
LDL Cholesterol: 128 mg/dL — ABNORMAL HIGH (ref 0–99)
Total CHOL/HDL Ratio: 4 RATIO
Triglycerides: 68 mg/dL (ref <150)
VLDL: 14 mg/dL (ref 0–40)

## 2022-03-21 LAB — POC SARS CORONAVIRUS 2 AG: SARSCOV2ONAVIRUS 2 AG: NEGATIVE

## 2022-03-21 LAB — HEMOGLOBIN A1C
Hgb A1c MFr Bld: 5.2 % (ref 4.8–5.6)
Mean Plasma Glucose: 102.54 mg/dL

## 2022-03-21 LAB — TSH: TSH: 1.075 u[IU]/mL (ref 0.350–4.500)

## 2022-03-21 MED ORDER — ACETAMINOPHEN 325 MG PO TABS: 650.0000 mg | ORAL_TABLET | Freq: Four times a day (QID) | ORAL | Status: DC | PRN

## 2022-03-21 MED ORDER — ALUM & MAG HYDROXIDE-SIMETH 200-200-20 MG/5ML PO SUSP: 30.0000 mL | ORAL | Status: DC | PRN

## 2022-03-21 MED ORDER — HYDROXYZINE HCL 25 MG PO TABS
25.0000 mg | ORAL_TABLET | Freq: Three times a day (TID) | ORAL | Status: DC | PRN
  Administered 2022-03-21: 25 mg via ORAL
  Filled 2022-03-21: qty 1

## 2022-03-21 MED ORDER — CITALOPRAM HYDROBROMIDE 20 MG PO TABS
20.0000 mg | ORAL_TABLET | Freq: Every day | ORAL | Status: DC
  Administered 2022-03-21: 20 mg via ORAL
  Filled 2022-03-21: qty 1

## 2022-03-21 MED ORDER — MAGNESIUM HYDROXIDE 400 MG/5ML PO SUSP: 30.0000 mL | Freq: Every day | ORAL | Status: DC | PRN

## 2022-03-21 MED ORDER — TRAZODONE HCL 50 MG PO TABS
50.0000 mg | ORAL_TABLET | Freq: Every evening | ORAL | Status: DC | PRN
  Administered 2022-03-21: 50 mg via ORAL
  Filled 2022-03-21: qty 1

## 2022-03-21 NOTE — ED Triage Notes (Signed)
Pt presents to [facility] voluntarily, accompanied by [employer] due to suicide attempt. Pt reports that he and his wife have been having issues and his anger and emotional state got the best of him. Pt reports tying a belt around his neck and attempting to end his life earlier tonight. Pt has stated diagnosis of depression and anxiety. Pt was prescribed medications years ago, but has not had medication in a about 2-3 years. Pt has had previous psychiatric impatient hospitalizations. Pt currently denies SI, HI, AVH.

## 2022-03-21 NOTE — ED Provider Notes (Signed)
[facility] Continuous Assessment Admission H&P  Date: 03/21/22 Patient Name: Jacob Wolf MRN: [id number] Chief Complaint: "I tried to kill myself".  Diagnoses:  Final diagnoses:  Suicide attempt by hanging, initial encounter ([place])  Severe episode of recurrent major depressive disorder, without psychotic features ([place])    HPI: Jacob Wolf is a 27 year old male with psychiatric history of depression and anxiety, who was brought in voluntarily to [facility] by [employer] with complaints of suicide attempt by hanging and depression.  Patient was seen face-to-face by this provider and chart reviewed. On evaluation, patient is alert, oriented x 4, and cooperative. Speech is clear and coherent. Pt appears well groomed. Eye contact is good. Mood is depressed, affect is non-congruent with mood. Patient has poor insight and is minimizing the severity of his action tonight. Thought process is coherent and thought content is WDL. Pt denies SI/HI/AVH. There is no objective indication that the patient is responding to internal stimuli. No delusions elicited during this assessment.   Patient reports "I got to work on my anger issues, and I tried to kill myself tonight, and I used a belt and tied it to the doorknob and wrapped it tightly around my neck and it's because I'm going through divorce with my wife". Patient reports "I'm not feeling suicidal right now, but at the time, I meant it, I love my wife".  Patient reports his wife found him, was cold towards him and didn't touch him, but she called the police, and they came and had a conversation with him, and brought him to the [facility].  Patient reports he usually reacts impulsively to difficult situations and has attempted suicide three times in the past at age 73, 56, 31, and today.  Patient reports he is unable to remember the details of the other 3 attempts, but was hospitalized twice for it. Patient reports he was prescribed Celexa for his depressive  symptoms, Atarax for anxiety and trazodone for sleep at that time, but last took any of these medications in 2021.  Patient reportedly wrote several suicide notes today in a notebook addressed specifically to his daughter, mom, grandparents, sister, and friends.  Patient reported he started writing the note at 8 AM and then finished the last note this evening before attempting suicide.  Patient reports he is not currently established with psychiatric services, but endorsed his willingness to start therapy and his antidepressant medication.  Patient reports he lives with his wife and Gantt daughter, and denies means or access to a gun or weapon at home.  Patient reports his sleep is poor and appetite is good.  Patient reports he is employed at [employer].  Patient denies illicit substance use, but endorses occasional alcohol use.  Support, encouragement, and reassurance provided about ongoing stressors.  Patient is provided with opportunity for questions.  Discussed recommendation for inpatient psychiatric admission, milieu and expectations. Patient is in agreement.  Patient gave permission for collateral to be obtained from his wife.  Collateral information was obtained from the patient's wife Jacob Wolf [phone], who reports that they've been arguing and talking for a while about getting a divorce, and she began talking to someone else and the patient found out and got mad, and became aggressive, and was hitting the walls and doors and carrying on yesterday, and she had to leave the home last night with her daughter to her mom's to spend the night.  Jacob Wolf reports she returned back home tonight at 6:30 PM and  avoided any confrontation with the patient because she wanted to pack up both her and daughter's belongings so she can leave for her birthday trip to [place].  She reports that while packing up her stuff, she heard something in the bathroom and was aware that the patient was in there, but  she had no idea what he was doing.  She reports she initially tried to ignore whatever the patient was doing in the bathroom, but then decided to go see what all the noise was about, that was when she saw him with a belt tightly tied around his neck and sitting on the bathroom floor, and she told him she was going to call the police and he got up and asked her to come and hold him which she refused because she didn't know what his state of mind was.  She reports the patient recently made statements months back that he was going to drown himself [place] a [place] and also recently that he was going to kill himself.  Total Time spent with patient: 20 minutes  Musculoskeletal  Strength & Muscle Tone: within normal limits Gait & Station: normal Patient leans: N/A  Psychiatric Specialty Exam  Presentation General Appearance:  Appropriate for Environment  Eye Contact: Good  Speech: Clear and Coherent  Speech Volume: Normal  Handedness: Right   Mood and Affect  Mood: Depressed  Affect: Non-Congruent   Thought Process  Thought Processes: Coherent  Descriptions of Associations:Intact  Orientation:Full (Time, Place and Person)  Thought Content:WDL    Hallucinations:Hallucinations: None  Ideas of Reference:None  Suicidal Thoughts:Suicidal Thoughts: No  Homicidal Thoughts:Homicidal Thoughts: No   Sensorium  Memory: Immediate Fair  Judgment: Poor  Insight: Poor   Executive Functions  Concentration: Good  Attention Span: Good  Recall: Good  Fund of Knowledge: Good  Language: Good   Psychomotor Activity  Psychomotor Activity: Psychomotor Activity: Normal   Assets  Assets: Communication Skills; Desire for Improvement; Social Support   Sleep  Sleep: Sleep: Poor   Nutritional Assessment (For OBS and FBC admissions only) Has the patient had a weight loss or gain of 10 pounds or more in the last 3 months?: No Has the patient had a decrease in  food intake/or appetite?: No Does the patient have dental problems?: No Does the patient have eating habits or behaviors that may be indicators of an eating disorder including binging or inducing vomiting?: No Has the patient recently lost weight without trying?: 0 Has the patient been eating poorly because of a decreased appetite?: 0 Malnutrition Screening Tool Score: 0    Physical Exam Constitutional:      General: He is not in acute distress.    Appearance: He is normal weight. He is not diaphoretic.  HENT:     Head: Normocephalic.     Right Ear: External ear normal.     Left Ear: External ear normal.     Nose: No congestion.  Eyes:     General:        Right eye: No discharge.        Left eye: No discharge.  Cardiovascular:     Rate and Rhythm: Normal rate.  Pulmonary:     Effort: Pulmonary effort is normal. No respiratory distress.  Chest:     Chest wall: No tenderness.  Neurological:     Mental Status: He is alert and oriented to person, place, and time.  Psychiatric:        Attention and Perception: Attention and  perception normal.        Mood and Affect: Mood is depressed.        Speech: Speech normal.        Behavior: Behavior is cooperative.        Thought Content: Thought content is not paranoid or delusional. Thought content does not include homicidal or suicidal ideation. Thought content does not include homicidal or suicidal plan.        Cognition and Memory: Cognition and memory normal.        Judgment: Judgment is impulsive.    Review of Systems  Constitutional:  Negative for chills, diaphoresis and fever.  HENT:  Negative for congestion.   Eyes:  Negative for discharge.  Respiratory:  Negative for cough, shortness of breath and wheezing.   Cardiovascular:  Negative for chest pain and palpitations.  Gastrointestinal:  Negative for diarrhea, nausea and vomiting.  Neurological:  Negative for seizures, loss of consciousness, weakness and headaches.   Psychiatric/Behavioral:  Positive for depression and suicidal ideas. Negative for hallucinations and substance abuse. The patient is not nervous/anxious.     Blood pressure 118/82, pulse 94, temperature 98.7 F (37.1 C), temperature source Oral, resp. rate 20, SpO2 99 %. There is no height or weight on file to calculate BMI.  Past Psychiatric History: See H & P   Is the patient at risk to self? Yes  Has the patient been a risk to self in the past 6 months? Yes .    Has the patient been a risk to self within the distant past? Yes   Is the patient a risk to others? No   Has the patient been a risk to others in the past 6 months? No   Has the patient been a risk to others within the distant past? No   Past Medical History:  See H & P  Family History: N/A  Social History: N/A  Last Labs:  Admission on 03/21/2022  Component Date Value Ref Range Status   POC Amphetamine UR 03/21/2022 None Detected  NONE DETECTED (Cut Off Level 1000 ng/mL) Preliminary   POC Secobarbital (BAR) 03/21/2022 None Detected  NONE DETECTED (Cut Off Level 300 ng/mL) Preliminary   POC Buprenorphine (BUP) 03/21/2022 None Detected  NONE DETECTED (Cut Off Level 10 ng/mL) Preliminary   POC Oxazepam (BZO) 03/21/2022 None Detected  NONE DETECTED (Cut Off Level 300 ng/mL) Preliminary   POC Cocaine UR 03/21/2022 None Detected  NONE DETECTED (Cut Off Level 300 ng/mL) Preliminary   POC Methamphetamine UR 03/21/2022 None Detected  NONE DETECTED (Cut Off Level 1000 ng/mL) Preliminary   POC Morphine 03/21/2022 None Detected  NONE DETECTED (Cut Off Level 300 ng/mL) Preliminary   POC Methadone UR 03/21/2022 None Detected  NONE DETECTED (Cut Off Level 300 ng/mL) Preliminary   POC Oxycodone UR 03/21/2022 None Detected  NONE DETECTED (Cut Off Level 100 ng/mL) Preliminary   POC Marijuana UR 03/21/2022 None Detected  NONE DETECTED (Cut Off Level 50 ng/mL) Preliminary   SARSCOV2ONAVIRUS 2 AG 03/21/2022 NEGATIVE  NEGATIVE Final    Comment: (NOTE) SARS-CoV-2 antigen NOT DETECTED.   Negative results are presumptive.  Negative results do not preclude SARS-CoV-2 infection and should not be used as the sole basis for treatment or other patient management decisions, including infection  control decisions, particularly in the presence of clinical signs and  symptoms consistent with COVID-19, or in those who have been in contact with the virus.  Negative results must be combined with clinical observations, patient  history, and epidemiological information. The expected result is Negative.  Fact Sheet for Patients: HandmadeRecipes.com.cy  Fact Sheet for Healthcare Providers: FuneralLife.at  This test is not yet approved or cleared by the [place] FDA and  has been authorized for detection and/or diagnosis of SARS-CoV-2 by FDA under an Emergency Use Authorization (EUA).  This EUA will remain in effect (meaning this test can be used) for the duration of  the COV                          ID-19 declaration under Section 564(b)(1) of the Act, 21 U.S.C. section 360bbb-3(b)(1), unless the authorization is terminated or revoked sooner.      Allergies: Patient has no known allergies.  Medications:  Facility Ordered Medications  Medication   acetaminophen (TYLENOL) tablet 650 mg   alum & mag hydroxide-simeth (MAALOX/MYLANTA) 200-200-20 MG/5ML suspension 30 mL   magnesium hydroxide (MILK OF MAGNESIA) suspension 30 mL   hydrOXYzine (ATARAX) tablet 25 mg   traZODone (DESYREL) tablet 50 mg   citalopram (CELEXA) tablet 20 mg   PTA Medications  Medication Sig   citalopram (CELEXA) 20 MG tablet Take 1 tablet (20 mg total) by mouth daily.   traZODone (DESYREL) 50 MG tablet Take 1 tablet (50 mg total) by mouth at bedtime as needed for sleep.   hydrOXYzine (ATARAX/VISTARIL) 25 MG tablet Take 1 tablet (25 mg total) by mouth every 6 (six) hours as needed for anxiety.    cephALEXin (KEFLEX) 500 MG capsule Take 1 capsule (500 mg total) by mouth 4 (four) times daily.   ibuprofen (ADVIL) 600 MG tablet Take 1 tablet (600 mg total) by mouth every 6 (six) hours as needed.    Medical Decision Making  Recommended inpatient psychiatric admission for stabilization and treatment.LCSW will reach out to [facility] for bed availability. Patient will be admitted to continuous obs unit for safety monitoring pending transfer to inpatient psychiatric unit. Lab Orders         Resp panel by RT-PCR (RSV, Flu A&B, Covid) Anterior Nasal Swab         CBC with Differential/Platelet         Comprehensive metabolic panel         Hemoglobin A1c         Lipid panel         TSH         POCT Urine Drug Screen - (I-Screen)         POC SARS Coronavirus 2 Ag      Home medication reordered this encounter -Celexa 20 mg PO daily for MDD  Prn meds -Tylenol 650 mg p.o. every 6 hours as needed pain -Maalox 30 mL p.o. every 4 hours as needed indigestion -Atarax 25 mg p.o. 3 times daily as needed anxiety -MOM 30 mL p.o. daily as needed constipation -Trazodone 50 mg p.o. nightly as needed sleep   Recommendations  Based on my evaluation the patient does not appear to have an emergency medical condition.  Recommend inpatient psychiatric admission for stabilization and treatment.  Randon Goldsmith, NP 03/21/22  9:25 PM

## 2022-03-21 NOTE — ED Notes (Signed)
Pt A&O x 4, presents with suicidal ideations,  pt wrapped a belt around hs neck and attached to doorknob.  Previous attempts noted.  Pt going through Divorce from Wife.  Pt calm & cooperative at present.  Monitoring for safety.

## 2022-03-21 NOTE — ED Notes (Signed)
Report called to RN [place], [place] [phone].  Comptroller.

## 2022-03-21 NOTE — BH Assessment (Signed)
Per [facility] Oluwatosin A. Olasunkanmi, patient accepted to [facility], pending an EKG. Consent to be faxed to [phone]. Rm [phone].   Darra Lis, MSW, LCSW Triage Specialist

## 2022-03-21 NOTE — BH Assessment (Signed)
Comprehensive Clinical Assessment (CCA) Note  03/21/2022 [place] C Nikolai [id number]  Disposition: Moody Bruins, NP recommends inpatient psychiatric admission.  The patient demonstrates the following risk factors for suicide: Chronic risk factors for suicide include: previous suicide attempts (patient unable to recall method) . Acute risk factors for suicide include: family or marital conflict. Protective factors for this patient include: responsibility to others (children, family) and hope for the future. Considering these factors, the overall suicide risk at this point appears to be high. Patient is not appropriate for outpatient follow up.  Patient is a 27 y.o. married male who presents voluntarily to [facility] via Event organiser following a suicide attempt by hanging. Patient reports he has a history of depression and anxiety. He states he and his wife Olive Bass [phone]) have been discussing divorce. Patient says he went through his wife's phone yesterday and saw she was talking to another guy. He states this was triggering because they agreed they would not talk to anyone else until they filed divorce paperwork. Patient reports this afternoon, he used a belt to wrap around his neck and tie to the bathroom doorknob in an attempt to hang himself. Patient says his wife walked in the restroom and found him. Patient states he has had two attempts in past at age 18 and age 89, however he does not recall how he attempted. Patient says he was hospitalized at [facility] and [place] following those incidents. Per chart review, patient was hospitalized at [facility] in October 2016. Patient acknowledges symptoms to include self blame, lack of sleep, and being easily irritated. Patient also endorses feelings of anxiousness, which causes shaking at times. Patient denies HI, auditory or visual hallucinations. Patient states he drinks socially sometimes and denies a history of substance use.   Patient identifies his  primary stressors as his relationship with his wife, finances, family conflict and the obsession to be great in life. Patient lives with his wife and their 35 month old daughter. They have been married for 11 months. 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 Patient states money is becoming hard to obtain. Additionally, patient expressed feelings stressed that his wife's family are encouraging her to move on and his family is not supportive. Patient states "my daughter is my only peace." Patient denies any history of abuse, however he says he heard a lot of arguing growing up. Patient denies any current legal problems. There are no firearms in the home.  Patient is currently not receiving any mental health services. Patient states he has never been in therapy, however he was prescribed Celexa, Trazodone and Atarax when he was 8 or 19. Patient reports he stopped taking the medication in 2021, so he could learn to cope on his own. He says the medication was helpful.  Patient is dressed casually, alert, oriented x4 with normal speech. Patient has good eye contact and his thought process is coherent. Patient has good insight and states he can be impulsive at times. There is no indication patient is responding to internal stimuli. Patient says he would like to get connected with therapy and try medication. Patient is cooperative throughout the assessment. Patient is willing to sign voluntarily for psychiatric admission.   Patient provided permission to speak with his wife Olive Bass. Danae Chen reports she and patient had agreed on getting a divorce and she started talking to someone else. She states last night patient became jealous, upset and became aggressive. Mrs. Arelia Longest reports patient  started punching the walls and table. She left the home with their daughter, to stay with her mom. Mrs. Jennelle Human says she went back to the home today about 6:30p and did not initally see patient. She says she kept  hearing noises in the bathroom and when she walked in, patient was on the bathroom floor, had a belt tied tightly around his neck and it was attached to the doorknob. Mrs. Jennelle Human states patient left the restroom and stated he just wanted her to hold him. She reports patient has made comments in the past, stating he was going to kill himself. She states a few months ago, patient disappeared and said he was going [place].    Chief Complaint:  Chief Complaint  Patient presents with   Suicide Attempt   Depression   Visit Diagnosis:   F33.2 Major depressive disorder, recurrent, severe without psychotic features   CCA Screening, Triage and Referral (STR)  Patient Reported Information How did you hear about [place]? Legal System ([employer])  What Is the Reason for Your Visit/Call Today? Patient presents voluntarily via law enforcement following a suicide attempt. Patient states "I need to work on my anger issues. I tried to kill myself. Patient denies HI, auditory or visual hallucinations.  How Long Has This Been Causing You Problems? <Week  What Do You Feel Would Help You the Most Today? Treatment for Depression or other mood problem   Have You Recently Had Any Thoughts About Hurting Yourself? Yes  Are You Planning to Commit Suicide/Harm Yourself At This time? Yes   Flowsheet Row ED from 03/21/2022 in [facility] ED from 02/04/2022 in [facility] Emergency Department at [facility] ED from 01/16/2022 in [facility] at [facility] RISK CATEGORY High Risk No Risk No Risk       Have you Recently Had Thoughts About Hurting Someone Karolee Ohs? No  Are You Planning to Harm Someone at This Time? No  Explanation: N/A   Have You Used Any Alcohol or Drugs in the Past 24 Hours? Yes  What Did You Use and How Much? A glass of moscato   Do You Currently Have a Therapist/Psychiatrist? No  Name of  Therapist/Psychiatrist: Name of Therapist/Psychiatrist: N/A   Have You Been Recently Discharged From Any Office Practice or Programs? No  Explanation of Discharge From Practice/Program: N/A     CCA Screening Triage Referral Assessment Type of Contact: Face-to-Face  Telemedicine Service Delivery:   Is this Initial or Reassessment?   Date Telepsych consult ordered in [facility]:    Time Telepsych consult ordered in [facility]:    Location of Assessment: [facility] Assessment Services  Provider Location: [facility] Assessment Services   Collateral Involvement: Lubertha Basque (spouse) [phone]   Does Patient Have a Court Appointed Legal Guardian? No  Legal Guardian Contact Information: N/A  Copy of Legal Guardianship Form: -- (N/A)  Legal Guardian Notified of Arrival: -- (N/A)  Legal Guardian Notified of Pending Discharge: -- (N/A)  If Minor and Not Living with Parent(s), Who has Custody? N/A  Is CPS involved or ever been involved? Never  Is APS involved or ever been involved? Never   Patient Determined To Be At Risk for Harm To Self or Others Based on Review of Patient Reported Information or Presenting Complaint? Yes, for Self-Harm  Method: Plan with intent and identified person  Availability of Means: In hand or used  Intent: Clearly intends on  inflicting harm that could cause death  Notification Required: No need or identified person  Additional Information for Danger to Others Potential: Previous attempts  Additional Comments for Danger to Others Potential: N/A  Are There Guns or Other Weapons in [place]? No  Types of Guns/Weapons: N/A  Are These Weapons Safely Secured?                            -- (N/A)  Who Could Verify You Are Able To Have These Secured: N/A  Do You Have any Outstanding Charges, Pending Court Dates, Parole/Probation? None  Contacted To Inform of Risk of Harm To Self or Others: Law Enforcement    Does Patient Present under Involuntary Commitment?  No    [place] of Residence: [place]   Patient Currently Receiving the Following Services: Not Receiving Services   Determination of Need: Emergent (2 hours)   Options For Referral: Inpatient Hospitalization; Medication Management     CCA Biopsychosocial Patient Reported Schizophrenia/Schizoaffective Diagnosis in Past: No   Strengths: Patient is able to articulate his feelings   Mental Health Symptoms Depression:   Sleep (too much or little); Irritability (Self blame)   Duration of Depressive symptoms:  Duration of Depressive Symptoms: Less than two weeks   Mania:   None   Anxiety:    Tension   Psychosis:   None   Duration of Psychotic symptoms:    Trauma:   None   Obsessions:   None   Compulsions:   None   Inattention:   None   Hyperactivity/Impulsivity:   None   Oppositional/Defiant Behaviors:   None   Emotional Irregularity:   None   Other Mood/Personality Symptoms:   N/A    Mental Status Exam Appearance and self-care  Stature:   Average   Weight:   Average weight   Clothing:   Casual   Grooming:   Normal   Cosmetic use:   None   Posture/gait:   Normal   Motor activity:   Not Remarkable   Sensorium  Attention:   Normal   Concentration:   Normal   Orientation:   X5   Recall/memory:   Normal   Affect and Mood  Affect:   Full Range   Mood:   Euthymic   Relating  Eye contact:   Normal   Facial expression:   Responsive   Attitude toward examiner:   Cooperative   Thought and Language  Speech flow:  Clear and Coherent   Thought content:   Appropriate to Mood and Circumstances   Preoccupation:   None   Hallucinations:   None   Organization:   Linear; Goal-directed   Transport planner of Knowledge:   Average   Intelligence:   Average   Abstraction:   Normal   Judgement:   Fair   Art therapist:   Realistic   Insight:   Good   Decision Making:   Impulsive    Social Functioning  Social Maturity:   Isolates   Social Judgement:   Normal   Stress  Stressors:   Relationship; Financial; Family conflict   Coping Ability:   Programme researcher, broadcasting/film/video Deficits:   None   Supports:   Family; Friends/Service system     Religion: Religion/Spirituality Are You A Religious Person?: Yes What is Your Religious Affiliation?: Christian How Might This Affect Treatment?: N/A  Leisure/Recreation: Leisure / Recreation Do You Have Hobbies?: Yes Leisure and Hobbies:  Wrestling and acting  Exercise/Diet: Exercise/Diet Do You Exercise?: Yes What Type of Exercise Do You Do?: Weight Training, Run/Walk How Many Times a Week Do You Exercise?: 1-3 times a week Have You Gained or Lost A Significant Amount of Weight in the Past Six Months?: No Do You Follow a Special Diet?: No Do You Have Any Trouble Sleeping?: Yes Explanation of Sleeping Difficulties: Patient reports he has always had trouble sleeping   CCA Employment/Education Employment/Work Situation: Employment / Work Situation Employment Situation: Employed Work Stressors: Patient states he feels pressure trying to great Patient's Job has Been Impacted by Current Illness: No Has Patient ever Been in Equities trader?: No  Education: Education Is Patient Currently Attending School?: No Last Grade Completed: 12 Did You Product manager?: No Did You Have An Individualized Education Program (IIEP): No Did You Have Any Difficulty At [employer]?: No Patient's Education Has Been Impacted by Current Illness: No   CCA Family/Childhood History Family and Relationship History: Family history Marital status: Single Does patient have children?: Yes How many children?: 1 How is patient's relationship with their children?: Positive  Childhood History:  Childhood History By whom was/is the patient raised?: Mother, Grandparents Did patient suffer any verbal/emotional/physical/sexual abuse as a child?:  No Did patient suffer from severe childhood neglect?: No Has patient ever been sexually abused/assaulted/raped as an adolescent or adult?: No Was the patient ever a victim of a crime or a disaster?: No Witnessed domestic violence?: No Has patient been affected by domestic violence as an adult?: No       CCA Substance Use Alcohol/Drug Use: Alcohol / Drug Use Pain Medications: See MAR Prescriptions: See MAR Over the Counter: See MAR History of alcohol / drug use?: No history of alcohol / drug abuse Longest period of sobriety (when/how long): N/A Negative Consequences of Use:  (N/A) Withdrawal Symptoms:  (N/A)                         ASAM's:  Six Dimensions of Multidimensional Assessment  Dimension 1:  Acute Intoxication and/or Withdrawal Potential:      Dimension 2:  Biomedical Conditions and Complications:      Dimension 3:  Emotional, Behavioral, or Cognitive Conditions and Complications:     Dimension 4:  Readiness to Change:     Dimension 5:  Relapse, Continued use, or Continued Problem Potential:     Dimension 6:  Recovery/Living Environment:     ASAM Severity Score:    ASAM Recommended Level of Treatment:     Substance use Disorder (SUD)    Recommendations for Services/Supports/Treatments:    Discharge Disposition:    DSM5 Diagnoses: Patient Active Problem List   Diagnosis Date Noted   Severe major depression, single episode (HCC) 12/05/2014   Major depressive disorder, single episode, severe without psychosis (HCC) 12/05/2014   Asthma, mild intermittent 10/29/2011     Referrals to Alternative Service(s): Referred to Alternative Service(s):   Place:   Date:   Time:    Referred to Alternative Service(s):   Place:   Date:   Time:    Referred to Alternative Service(s):   Place:   Date:   Time:    Referred to Alternative Service(s):   Place:   Date:   Time:     Cleda Clarks, LCSW

## 2022-03-21 NOTE — ED Notes (Signed)
Pt was given a UDS cup for when he has to use bathroom. Pt could not go to bathroom  at this time.

## 2022-03-21 NOTE — ED Notes (Signed)
Pt pending report & transfer to [facility], pt signed Voluntary Form.

## 2022-03-21 NOTE — ED Notes (Signed)
Dash called for stat pick up talked with Deneise Lever

## 2022-03-22 ENCOUNTER — Encounter (HOSPITAL_COMMUNITY): Payer: Self-pay | Admitting: Psychiatry

## 2022-03-22 ENCOUNTER — Encounter (HOSPITAL_COMMUNITY): Payer: Self-pay

## 2022-03-22 ENCOUNTER — Inpatient Hospital Stay (HOSPITAL_COMMUNITY)
Admission: AD | Admit: 2022-03-22 | Discharge: 2022-03-28 | DRG: 885 | Disposition: A | Discharge status: Home / Self Care | Payer: BLUE CROSS/BLUE SHIELD | Source: Intra-hospital | Attending: Psychiatry | Admitting: Psychiatry

## 2022-03-22 DIAGNOSIS — T71162A Asphyxiation due to hanging, intentional self-harm, initial encounter: Secondary | ICD-10-CM | POA: Diagnosis present

## 2022-03-22 DIAGNOSIS — J4520 Mild intermittent asthma, uncomplicated: Secondary | ICD-10-CM | POA: Diagnosis present

## 2022-03-22 DIAGNOSIS — Z79899 Other long term (current) drug therapy: Secondary | ICD-10-CM

## 2022-03-22 DIAGNOSIS — F322 Major depressive disorder, single episode, severe without psychotic features: Secondary | ICD-10-CM | POA: Diagnosis present

## 2022-03-22 DIAGNOSIS — Z1152 Encounter for screening for COVID-19: Secondary | ICD-10-CM

## 2022-03-22 DIAGNOSIS — Z630 Problems in relationship with spouse or partner: Secondary | ICD-10-CM | POA: Diagnosis not present

## 2022-03-22 DIAGNOSIS — F332 Major depressive disorder, recurrent severe without psychotic features: Principal | ICD-10-CM | POA: Insufficient documentation

## 2022-03-22 DIAGNOSIS — F419 Anxiety disorder, unspecified: Secondary | ICD-10-CM | POA: Diagnosis present

## 2022-03-22 MED ORDER — CITALOPRAM HYDROBROMIDE 20 MG PO TABS
20.0000 mg | ORAL_TABLET | Freq: Every day | ORAL | Status: DC
  Administered 2022-03-23 – 2022-03-28 (×6): 20 mg via ORAL
  Filled 2022-03-22 (×8): qty 1

## 2022-03-22 MED ORDER — HYDROXYZINE HCL 25 MG PO TABS
25.0000 mg | ORAL_TABLET | Freq: Three times a day (TID) | ORAL | Status: DC | PRN
  Administered 2022-03-22 – 2022-03-27 (×6): 25 mg via ORAL
  Filled 2022-03-22 (×7): qty 1

## 2022-03-22 MED ORDER — ACETAMINOPHEN 325 MG PO TABS: 650.0000 mg | ORAL_TABLET | Freq: Four times a day (QID) | ORAL | Status: DC | PRN

## 2022-03-22 MED ORDER — TRAZODONE HCL 50 MG PO TABS: 50.0000 mg | ORAL_TABLET | Freq: Every evening | ORAL | Status: DC | PRN

## 2022-03-22 MED ORDER — MAGNESIUM HYDROXIDE 400 MG/5ML PO SUSP: 30.0000 mL | Freq: Every day | ORAL | Status: DC | PRN

## 2022-03-22 MED ORDER — ALUM & MAG HYDROXIDE-SIMETH 200-200-20 MG/5ML PO SUSP: 30.0000 mL | ORAL | Status: DC | PRN

## 2022-03-22 NOTE — H&P (Signed)
Psychiatric Admission Assessment Adult  Patient Identification: Jacob Wolf  MRN:  [id number]  Date of Evaluation:  03/22/2022  Chief Complaint:  Suicide attempt by hanging, initial encounter [facility]) [T71.162A]  Principal Diagnosis: Major depressive disorder, recurrent episode, severe ([place])  Diagnosis:  Principal Problem:   Major depressive disorder, recurrent episode, severe ([place]) Active Problems:   Major depressive disorder, single episode, severe without psychosis ([place])   Suicide attempt by hanging, initial encounter ([place])  History of Present Illness: This is the second psychiatric admission.evaluation in this [facility] for this 27 year old AA male with previous hx of mental illness. Jacob was a patient in this [facility] in October of 2016 with complaint of worsening symptoms of depression triggering suicidal ideations with some ideas to hang himself. He described his feelings then as wanting to die. The trigger at the time was relationship break-up. He was treated, stabilized & discharged on Citalopram. Jacob is being admitted to the [facility] this time with complain of worsening depression/suicidal ideation with an attempt to hang himself with a belt. This again was triggered by relationship problems as well. Chart review indicated that patient & wife were talking divorce, he saw on his wife's phone that she has already started talking to someone else. During this evaluation, Jacob reports,   "My wife & I were arguing about divorce, I got upset & tried to hurt myself. I tried to strangle myself with a belt. This happened yesterday.  I was cool up until yesterday. We have been married for 11 months & we have a 64 months old daughter. We have to work on a lot of things, that is all. While we were arguing yesterday, I found out something about my wife that I did not like. It is about what she did. There is someone else already in the picture. I do have hx of depression, was on medication up until 2021 when I  ran out of refills. My mood has been okay until yesterday. I tried or attempted to hurt myself at 73, but I don't remember the details any more. I'm feeling okay now. Being here is depressing me. I did what I did out of anger. I was not intending to kill myself. It was a mistake. My mood is okay. I just want to go home".  Objective: Jacob presents alert, oriented & aware of situation. He was lying down in bed during this evaluation. He presents as good historian. He says what he did trying to wrap a belt around his neck was a spur of the moment because he was feeling angry & disappointed. He says he is doing well now & ready to go home. Staff reports that patient declined to take his Citalopram this morning. Reports indicated he was complaining of feeling dizzy this morning as well. Discussed this case with the attending psychiatrist. It was decided to stop the Citalopram & start Prozac. Patient declined the Prozac, decided to remain on Celexa. He says he did not take the Celexa in the morning because he was feeling dizzy then. He says the dizziness is better.  Associated Signs/Symptoms:  Depression Symptoms:  depressed mood, feelings of worthlessness/guilt, hopelessness, anxiety,  (Hypo) Manic Symptoms:  Impulsivity,  Anxiety Symptoms:  Excessive Worry,  Psychotic Symptoms:   Denies any AVH, delusional thoughts or paranoia. He does not appear to be responding to any internal stimuli.  PTSD Symptoms: NA  Total Time spent with patient: 1 hour  Past Psychiatric History: Major depressive disorder, previous psychiatric admission  here at [facility] in 2016.  Is the patient at risk to self? No.  Has the patient been a risk to self in the past 6 months? Yes.    Has the patient been a risk to self within the distant past? Yes.    Is the patient a risk to others? No.  Has the patient been a risk to others in the past 6 months? No.  Has the patient been a risk to others within the distant past? No.    [place] Scale:  Flowsheet Row Ad[facility] ([facility]) [facility] from 03/21/2022 in [facility] ED from 02/04/2022 in [facility] Department at [facility]  C-SSRS RISK CATEGORY High Risk High Risk No Risk       Prior Inpatient Therapy: Yes.   If yes, describe: [facility].   Prior Outpatient Therapy: Yes.   If yes, describe: "I don't remember".   Alcohol Screening: Patient refused Alcohol Screening Tool: Yes 1. How often do you have a drink containing alcohol?: Never 2. How many drinks containing alcohol do you have on a typical day when you are drinking?: 1 or 2 3. How often do you have six or more drinks on one occasion?: Never AUDIT-C Score: 0 4. How often during the last year have you found that you were not able to stop drinking once you had started?: Never 5. How often during the last year have you failed to do what was normally expected from you because of drinking?: Never 6. How often during the last year have you needed a first drink in the morning to get yourself going after a heavy drinking session?: Never 7. How often during the last year have you had a feeling of guilt of remorse after drinking?: Never 8. How often during the last year have you been unable to remember what happened the night before because you had been drinking?: Never 9. Have you or someone else been injured as a result of your drinking?: No 10. Has a relative or friend or a doctor or another health worker been concerned about your drinking or suggested you cut down?: No Alcohol Use Disorder Identification Test Final Score (AUDIT): 0 Alcohol Brief Interventions/Follow-up: Patient Refused  Substance Abuse History in the last 12 months:  No.  Consequences of Substance Abuse: NA  Previous Psychotropic Medications:  Yes, Citalopram.  Psychological Evaluations: No   Past Medical History:  Past Medical History:   Diagnosis Date   Asthma    Asthma, mild intermittent 10/29/2011   Concussion     Past Surgical History:  Procedure Laterality Date   APPENDECTOMY     LAPAROSCOPIC APPENDECTOMY  04/16/2011   Procedure: APPENDECTOMY LAPAROSCOPIC;  Surgeon: Judie Petit. Leonia Corona, MD;  Location: [facility] OR;  Service: Pediatrics;  Laterality: N/A;   Lt toe removal     Family History: History reviewed. No pertinent family history.  Family Psychiatric  History: Patient denies any familial hx of mental illness.   Tobacco Screening: "I never smoked cigarettes". Social History   Tobacco Use  Smoking Status Never  Smokeless Tobacco Never    BH Tobacco Counseling     Are you interested in Tobacco Cessation Medications?  N/A, patient does not use tobacco products Counseled patient on smoking cessation:  N/A, patient does not use tobacco products Reason Tobacco Screening Not Completed: No value filed.       Social History: Married, having some marital problems, has one  child, employed, lives in [place], [place]. Social History   Substance and Sexual Activity  Alcohol Use No     Social History   Substance and Sexual Activity  Drug Use No    Additional Social History:  Allergies:  No Known Allergies  Lab Results:  Results for orders placed or performed during the hospital encounter of 03/21/22 (from the past 48 hour(s))  Resp panel by RT-PCR (RSV, Flu A&B, Covid) Anterior Nasal Swab     Status: None   Collection Time: 03/21/22  8:42 PM   Specimen: Anterior Nasal Swab  Result Value Ref Range   SARS Coronavirus 2 by RT PCR NEGATIVE NEGATIVE    Comment: (NOTE) SARS-CoV-2 target nucleic acids are NOT DETECTED.  The SARS-CoV-2 RNA is generally detectable in upper respiratory specimens during the acute phase of infection. The lowest concentration of SARS-CoV-2 viral copies this assay can detect is 138 copies/mL. A negative result does not preclude SARS-Cov-2 infection and should not be used as the sole  basis for treatment or other patient management decisions. A negative result may occur with  improper specimen collection/handling, submission of specimen other than nasopharyngeal swab, presence of viral mutation(s) within the areas targeted by this assay, and inadequate number of viral copies(<138 copies/mL). A negative result must be combined with clinical observations, patient history, and epidemiological information. The expected result is Negative.  Fact Sheet for Patients:  BloggerCourse.com  Fact Sheet for Healthcare Providers:  SeriousBroker.it  This test is no t yet approved or cleared by the [place] FDA and  has been authorized for detection and/or diagnosis of SARS-CoV-2 by FDA under an Emergency Use Authorization (EUA). This EUA will remain  in effect (meaning this test can be used) for the duration of the COVID-19 declaration under Section 564(b)(1) of the Act, 21 U.S.C.section 360bbb-3(b)(1), unless the authorization is terminated  or revoked sooner.       Influenza A by PCR NEGATIVE NEGATIVE   Influenza B by PCR NEGATIVE NEGATIVE    Comment: (NOTE) The Xpert Xpress SARS-CoV-2/FLU/RSV plus assay is intended as an aid in the diagnosis of influenza from Nasopharyngeal swab specimens and should not be used as a sole basis for treatment. Nasal washings and aspirates are unacceptable for Xpert Xpress SARS-CoV-2/FLU/RSV testing.  Fact Sheet for Patients: BloggerCourse.com  Fact Sheet for Healthcare Providers: SeriousBroker.it  This test is not yet approved or cleared by the [place] FDA and has been authorized for detection and/or diagnosis of SARS-CoV-2 by FDA under an Emergency Use Authorization (EUA). This EUA will remain in effect (meaning this test can be used) for the duration of the COVID-19 declaration under Section 564(b)(1) of the Act, 21  U.S.C. section 360bbb-3(b)(1), unless the authorization is terminated or revoked.     Resp Syncytial Virus by PCR NEGATIVE NEGATIVE    Comment: (NOTE) Fact Sheet for Patients: BloggerCourse.com  Fact Sheet for Healthcare Providers: SeriousBroker.it  This test is not yet approved or cleared by the [place] FDA and has been authorized for detection and/or diagnosis of SARS-CoV-2 by FDA under an Emergency Use Authorization (EUA). This EUA will remain in effect (meaning this test can be used) for the duration of the COVID-19 declaration under Section 564(b)(1) of the Act, 21 U.S.C. section 360bbb-3(b)(1), unless the authorization is terminated or revoked.  Performed at [facility], [address], [place], [place] [address]   CBC with Differential/Platelet     Status: None   Collection Time: 03/21/22  8:43  PM  Result Value Ref Range   WBC 5.7 4.0 - 10.5 K/uL   RBC 5.52 4.22 - 5.81 MIL/uL   Hemoglobin 16.8 13.0 - 17.0 g/dL   HCT 48.2 39.0 - 52.0 %   MCV 87.3 80.0 - 100.0 fL   MCH 30.4 26.0 - 34.0 pg   MCHC 34.9 30.0 - 36.0 g/dL   RDW 12.3 11.5 - 15.5 %   Platelets 312 150 - 400 K/uL   nRBC 0.0 0.0 - 0.2 %   Neutrophils Relative % 60 %   Neutro Abs 3.4 1.7 - 7.7 K/uL   Lymphocytes Relative 33 %   Lymphs Abs 1.9 0.7 - 4.0 K/uL   Monocytes Relative 7 %   Monocytes Absolute 0.4 0.1 - 1.0 K/uL   Eosinophils Relative 0 %   Eosinophils Absolute 0.0 0.0 - 0.5 K/uL   Basophils Relative 0 %   Basophils Absolute 0.0 0.0 - 0.1 K/uL   Immature Granulocytes 0 %   Abs Immature Granulocytes 0.02 0.00 - 0.07 K/uL    Comment: Performed at [facility], [address] [place], [place] [address]  Comprehensive metabolic panel     Status: Abnormal   Collection Time: 03/21/22  8:43 PM  Result Value Ref Range   Sodium 139 135 - 145 mmol/L   Potassium 4.0 3.5 - 5.1 mmol/L   Chloride 101 98 - 111 mmol/L   CO2 28 22 - 32  mmol/L   Glucose, Bld 104 (H) 70 - 99 mg/dL    Comment: Glucose reference range applies only to samples taken after fasting for at least 8 hours.   BUN 9 6 - 20 mg/dL   Creatinine, Ser 0.95 0.61 - 1.24 mg/dL   Calcium 9.6 8.9 - 10.3 mg/dL   Total Protein 7.6 6.5 - 8.1 g/dL   Albumin 4.4 3.5 - 5.0 g/dL   AST 24 15 - 41 U/L   ALT 32 0 - 44 U/L   Alkaline Phosphatase 54 38 - 126 U/L   Total Bilirubin 0.8 0.3 - 1.2 mg/dL   GFR, Estimated >60 >60 mL/min    Comment: (NOTE) Calculated using the CKD-EPI Creatinine Equation (2021)    Anion gap 10 5 - 15    Comment: Performed at [facility] [address], [place], [place] [address]  Hemoglobin A1c     Status: None   Collection Time: 03/21/22  8:43 PM  Result Value Ref Range   Hgb A1c MFr Bld 5.2 4.8 - 5.6 %    Comment: (NOTE) Pre diabetes:          5.7%-6.4%  Diabetes:              >6.4%  Glycemic control for   <7.0% adults with diabetes    Mean Plasma Glucose 102.54 mg/dL    Comment: Performed at [facility] [address], [place], [place] [address]  Lipid panel     Status: Abnormal   Collection Time: 03/21/22  8:43 PM  Result Value Ref Range   Cholesterol 190 0 - 200 mg/dL   Triglycerides 68 <150 mg/dL   HDL 48 >40 mg/dL   Total CHOL/HDL Ratio 4.0 RATIO   VLDL 14 0 - 40 mg/dL   LDL Cholesterol 128 (H) 0 - 99 mg/dL    Comment:        Total Cholesterol/HDL:CHD Risk Coronary Heart Disease Risk Table  Men   Women  1/2 Average Risk   3.4   3.3  Average Risk       5.0   4.4  2 X Average Risk   9.6   7.1  3 X Average Risk  23.4   11.0        Use the calculated Patient Ratio above and the CHD Risk Table to determine the patient's CHD Risk.        ATP III CLASSIFICATION (LDL):  <100     mg/dL   Optimal  [id number]  mg/dL   Near or Above                    Optimal  130-159  mg/dL   Borderline  [id number]  mg/dL   High  >829>190     mg/dL   Very High Performed at [facility], [address] [place], [place] [address]   TSH     Status: None   Collection Time: 03/21/22  8:43 PM  Result Value Ref Range   TSH 1.075 0.350 - 4.500 uIU/mL    Comment: Performed by a 3rd Generation assay with a functional sensitivity of <=0.01 uIU/mL. Performed at [facility], [address] [place], [place] [address]   POC SARS Coronavirus 2 Ag     Status: None   Collection Time: 03/21/22  8:57 PM  Result Value Ref Range   SARSCOV2ONAVIRUS 2 AG NEGATIVE NEGATIVE    Comment: (NOTE) SARS-CoV-2 antigen NOT DETECTED.   Negative results are presumptive.  Negative results do not preclude SARS-CoV-2 infection and should not be used as the sole basis for treatment or other patient management decisions, including infection  control decisions, particularly in the presence of clinical signs and  symptoms consistent with COVID-19, or in those who have been in contact with the virus.  Negative results must be combined with clinical observations, patient history, and epidemiological information. The expected result is Negative.  Fact Sheet for Patients: https://www.jennings-kim.com/https://www.fda.gov/media/141569/download  Fact Sheet for Healthcare Providers: https://alexander-rogers.biz/https://www.fda.gov/media/141568/download  This test is not yet approved or cleared by the [place] FDA and  has been authorized for detection and/or diagnosis of SARS-CoV-2 by FDA under an Emergency Use Authorization (EUA).  This EUA will remain in effect (meaning this test can be used) for the duration of  the COV ID-19 declaration under Section 564(b)(1) of the Act, 21 U.S.C. section 360bbb-3(b)(1), unless the authorization is terminated or revoked sooner.    POCT Urine Drug Screen - (I-Screen)     Status: None (Preliminary result)   Collection Time: 03/21/22  9:23 PM  Result Value Ref Range   POC Amphetamine UR None Detected NONE DETECTED (Cut Off Level 1000 ng/mL)   POC Secobarbital (BAR) None Detected NONE DETECTED (Cut Off Level 300 ng/mL)   POC  Buprenorphine (BUP) None Detected NONE DETECTED (Cut Off Level 10 ng/mL)   POC Oxazepam (BZO) None Detected NONE DETECTED (Cut Off Level 300 ng/mL)   POC Cocaine UR None Detected NONE DETECTED (Cut Off Level 300 ng/mL)   POC Methamphetamine UR None Detected NONE DETECTED (Cut Off Level 1000 ng/mL)   POC Morphine None Detected NONE DETECTED (Cut Off Level 300 ng/mL)   POC Methadone UR None Detected NONE DETECTED (Cut Off Level 300 ng/mL)   POC Oxycodone UR None Detected NONE DETECTED (Cut Off Level 100 ng/mL)   POC Marijuana UR None Detected NONE DETECTED (Cut Off Level 50 ng/mL)   Blood  Alcohol level:  Lab Results  Component Value Date   ETH <5 12/04/2014   Metabolic Disorder Labs:  Lab Results  Component Value Date   HGBA1C 5.2 03/21/2022   MPG 102.54 03/21/2022   No results found for: "PROLACTIN" Lab Results  Component Value Date   CHOL 190 03/21/2022   TRIG 68 03/21/2022   HDL 48 03/21/2022   CHOLHDL 4.0 03/21/2022   VLDL 14 03/21/2022   LDLCALC 128 (H) 03/21/2022   Current Medications: Current Facility-Administered Medications  Medication Dose Route Frequency Provider Last Rate Last Admin   acetaminophen (TYLENOL) tablet 650 mg  650 mg Oral Q6H PRN Onuoha, Chinwendu V, NP       alum & mag hydroxide-simeth (MAALOX/MYLANTA) 200-200-20 MG/5ML suspension 30 mL  30 mL Oral Q4H PRN Onuoha, Chinwendu V, NP       citalopram (CELEXA) tablet 20 mg  20 mg Oral Daily Onuoha, Chinwendu V, NP       hydrOXYzine (ATARAX) tablet 25 mg  25 mg Oral TID PRN Onuoha, Chinwendu V, NP       magnesium hydroxide (MILK OF MAGNESIA) suspension 30 mL  30 mL Oral Daily PRN Onuoha, Chinwendu V, NP       traZODone (DESYREL) tablet 50 mg  50 mg Oral QHS PRN Onuoha, Chinwendu V, NP       PTA Medications: No medications prior to admission.   Musculoskeletal: Strength & Muscle Tone: within normal limits Gait & Station: normal Patient leans: N/A  Psychiatric Specialty Exam:  Presentation  General  Appearance:  Appropriate for Environment  Eye Contact: Good  Speech: Clear and Coherent  Speech Volume: Normal  Handedness: Right  Mood and Affect  Mood: Depressed  Affect: Non-Congruent  Thought Process  Thought Processes: Coherent  Duration of Psychotic Symptoms: Greater than one week.  Past Diagnosis of Schizophrenia or Psychoactive disorder: No  Descriptions of Associations:Intact  Orientation:Full (Time, Place and Person)  Thought Content:WDL  Hallucinations:Hallucinations: None  Ideas of Reference:None  Suicidal Thoughts:Suicidal Thoughts: No  Homicidal Thoughts:Homicidal Thoughts: No  Sensorium  Memory: Immediate Fair  Judgment: Poor  Insight: Poor  Executive Functions  Concentration: Good  Attention Span: Good  Recall: Good  Fund of Knowledge: Good  Language: Good  Psychomotor Activity  Psychomotor Activity: Psychomotor Activity: Normal  Assets  Assets: Communication Skills; Desire for Improvement; Social Support  Sleep  Sleep: Sleep: Poor  Physical Exam: Physical Exam Vitals and nursing note reviewed.  HENT:     Head: Normocephalic.     Nose: Nose normal.     Mouth/Throat:     Pharynx: Oropharynx is clear.  Eyes:     Pupils: Pupils are equal, round, and reactive to light.  Cardiovascular:     Rate and Rhythm: Normal rate.     Pulses: Normal pulses.  Pulmonary:     Effort: Pulmonary effort is normal.  Genitourinary:    Comments: Deferred Musculoskeletal:        General: Normal range of motion.     Cervical back: Normal range of motion.  Skin:    General: Skin is warm and dry.  Neurological:     General: No focal deficit present.     Mental Status: He is alert and oriented to person, place, and time.    Review of Systems  Constitutional:  Negative for chills, diaphoresis and fever.  HENT:  Negative for congestion and sore throat.   Eyes:  Negative for blurred vision.  Respiratory:  Negative for  cough, shortness  of breath and wheezing.   Cardiovascular:  Negative for chest pain and palpitations.  Gastrointestinal:  Negative for abdominal pain, constipation, diarrhea, heartburn, nausea and vomiting.  Genitourinary:  Negative for dysuria.  Musculoskeletal:  Negative for joint pain and myalgias.  Skin:  Negative for itching and rash.  Neurological:  Negative for tingling, tremors, sensory change, speech change, focal weakness, seizures, loss of consciousness, weakness and headaches.  Psychiatric/Behavioral:  Positive for depression. Negative for hallucinations, memory loss, substance abuse and suicidal ideas (Hx of). The patient has insomnia. The patient is not nervous/anxious.    Blood pressure 117/87, pulse 78, temperature 98.5 F (36.9 C), temperature source Oral, resp. rate 18, height 5\' 8"  (1.727 m), weight 78 kg, SpO2 98 %. Body mass index is 26.15 kg/m.  Treatment Plan Summary: Daily contact with patient to assess and evaluate symptoms and progress in treatment and Medication management.   Principal/active diagnoses.  Major depressive disorder, recurrent episode, severe (HCC)  Plan:  -Continue Citalopram 20 mg po daily for depression. -Continue Hydroxyzine 25 mg po tid prn for anxiety.  -Trazodone 50 mg po Q bedtime prn for insomnia.  Other PRNS -Continue Tylenol 650 mg every 6 hours PRN for mild pain -Continue Maalox 30 ml Q 4 hrs PRN for indigestion -Continue MOM 30 ml po Q 6 hrs for constipation  Safety and Monitoring: Voluntary admission to inpatient psychiatric unit for safety, stabilization and treatment Daily contact with patient to assess and evaluate symptoms and progress in treatment Patient's case to be discussed in multi-disciplinary team meeting Observation Level : q15 minute checks Vital signs: q12 hours Precautions: Safety  Discharge Planning: Social work and case management to assist with discharge planning and identification of hospital follow-up  needs prior to discharge Estimated LOS: 5-7 days Discharge Concerns: Need to establish a safety plan; Medication compliance and effectiveness Discharge Goals: Return home with outpatient referrals for mental health follow-up including medication management/psychotherapy  Observation Level/Precautions:  15 minute checks  Laboratory:   Per ED, current lab results reviewed.  Psychotherapy:  Enrolled in the group sessions.   [facility]  [facility]  Consultations: As needed.  Discharge Concerns: Safety, mood stability.  Estimated LOS: 3-5 days.  Other: NA   Physician Treatment Plan for Primary Diagnosis: Major depressive disorder, recurrent episode, severe (HCC)  Long Term Goal(s): Improvement in symptoms so as ready for discharge  Short Term Goals: Ability to identify changes in lifestyle to reduce recurrence of condition will improve, Ability to verbalize feelings will improve, Ability to disclose and discuss suicidal ideas, and Ability to demonstrate self-control will improve  Physician Treatment Plan for Secondary Diagnosis: Principal Problem:   Major depressive disorder, recurrent episode, severe (HCC) Active Problems:   Major depressive disorder, single episode, severe without psychosis (HCC)   Suicide attempt by hanging, initial encounter (HCC)  Long Term Goal(s): Improvement in symptoms so as ready for discharge  Short Term Goals: Ability to identify and develop effective coping behaviors will improve, Ability to maintain clinical measurements within normal limits will improve, Compliance with prescribed medications will improve, and Ability to identify triggers associated with substance abuse/mental health issues will improve  I certify that inpatient services furnished can reasonably be expected to improve the patient's condition.    Armandina StammerAgnes Emani Morad, NP, pmhnp, fnp-bc 1/26/20241:32 PM

## 2022-03-22 NOTE — Progress Notes (Signed)
Pt was accepted to [facility] 03/22/22 PENDING EKG and vol consent faxed to [phone]; Bed Assignment [id number]   Pt meets inpatient criteria per Moody Bruins, NP  Attending Physician will be Dr. Talmadge Chad  Report can be called to: - Child and Adolescence unit: [phone] -Adult unit: [phone]  Pt can arrive after PENDING Items  [facility] staff aware  Nadara Mode, [place] 03/22/2022 @ 12:24 AM

## 2022-03-22 NOTE — Progress Notes (Signed)
  Admission Note:  Patient is a 27 - year- old male voluntarily admitted to  the adult unit, room 402-01, from [facility]  ED due to suicidal attempt by hanging.  Patient stated "I try to harm myself, but my wife caught me, I am going through divorce  with my wife right now, I used a belt around my neck trying to strangle myself." Patient reports past suicidal attempts at 27 years old, and stated "I don't remember what happened." Patient sated his SI was "just in the moment thing." Patient denies SI,HI, and AVH during the assessment. Patient denies Alcohol, drugs, and tobacco use. Patient stated he works at the [employer], reports primary stressors including marital issues, financial problems, and lack of understanding from his wife. Patient alert and oriented x 4,  and cooperative with assessment. Patient appears depressed and sad, patient denies depression, reports anxiety 2/10, patient  repeatedly stated "I'm fine, I want to get back home."  Emotional support provided, patient verbally contracts for safety, patient given opportunity to ask questions, admission expectations explained to patient, patient's belongings searched and locked up in the locker per protocol. Skin assessment completed, patient noted with multiple tattoos behind ears, anterior chest wall, and right full arm, patient has a scar on  his left posterior ankle. Patient oriented to the unit and room. Safety maintained via  Q 15 minutes observations, will continue to monitor.

## 2022-03-22 NOTE — Plan of Care (Signed)
  Problem: Self-Concept: Goal: Will verbalize positive feelings about self Outcome: Progressing   Problem: Education: Goal: Mental status will improve Outcome: Progressing   Problem: Safety: Goal: Periods of time without injury will increase Outcome: Progressing

## 2022-03-22 NOTE — Group Note (Signed)
Date:  03/22/2022 Time:  4:46 PM  Group Topic/Focus:  Wellness Toolbox:   The focus of this group is to discuss various aspects of wellness, balancing those aspects and exploring ways to increase the ability to experience wellness.  Patients will create a wellness toolbox for use upon discharge.    Participation Level:  Active  Participation Quality:  Appropriate  Affect:  Appropriate  Cognitive:  Appropriate  Insight: Appropriate  Engagement in Group:  Engaged  Modes of Intervention:  Exploration  Additional Comments:  Patient explored ways he could practice self-care by putting his needs first, and  being kind to himself first before being concerned with others.    Jerrye Beavers 03/22/2022, 4:46 PM

## 2022-03-22 NOTE — Plan of Care (Signed)
  Problem: Coping: Goal: Coping ability will improve Outcome: Progressing   Problem: Medication: Goal: Compliance with prescribed medication regimen will improve Outcome: Progressing

## 2022-03-22 NOTE — ED Notes (Signed)
Safe Transport requested. 

## 2022-03-22 NOTE — BHH Suicide Risk Assessment (Signed)
Suicide Risk Assessment  Admission Assessment    [facility] Admission Suicide Risk Assessment   Nursing information obtained from:  Patient  Demographic factors:  Male, Adolescent or [place] adult  Current Mental Status:  Self-harm thoughts, Self-harm behaviors, Intention to act on suicide plan  Loss Factors:  Loss of significant relationship, Financial problems / change in socioeconomic status  Historical Factors:  Prior suicide attempts  Risk Reduction Factors:  Employed, Responsible for children under 75 years of age, Sense of responsibility to family, Religious beliefs about death, Living with another person, especially a relative  Total Time spent with patient: 1 hour  Principal Problem: Suicide attempt by hanging, initial encounter ([place])  Diagnosis:  Principal Problem:   Suicide attempt by hanging, initial encounter ([place])  Subjective Data: See H&P.  Continued Clinical Symptoms:  Alcohol Use Disorder Identification Test Final Score (AUDIT): 0 The "Alcohol Use Disorders Identification Test", Guidelines for Use in Primary Care, Second Edition.  World Pharmacologist [facility]). Score between 0-7:  no or low risk or alcohol related problems. Score between 8-15:  moderate risk of alcohol related problems. Score between 16-19:  high risk of alcohol related problems. Score 20 or above:  warrants further diagnostic evaluation for alcohol dependence and treatment.  CLINICAL FACTORS:   Depression:   Impulsivity Previous Psychiatric Diagnoses and Treatments  Musculoskeletal: Strength & Muscle Tone: within normal limits Gait & Station: normal Patient leans: N/A  Psychiatric Specialty Exam:  Presentation  General Appearance:  Appropriate for Environment  Eye Contact: Good  Speech: Clear and Coherent  Speech Volume: Normal  Handedness: Right  Mood and Affect  Mood: Depressed  Affect: Non-Congruent  Thought Process  Thought Processes: Coherent  Descriptions of  Associations:Intact  Orientation:Full (Time, Place and Person)  Thought Content:WDL  History of Schizophrenia/Schizoaffective disorder:No  Duration of Psychotic Symptoms:No data recorded Hallucinations:Hallucinations: None  Ideas of Reference:None  Suicidal Thoughts:Suicidal Thoughts: No  Homicidal Thoughts:Homicidal Thoughts: No  Sensorium  Memory: Immediate Fair  Judgment: Poor  Insight: Poor   Executive Functions  Concentration: Good  Attention Span: Good  Recall: Good  Fund of Knowledge: Good  Language: Good  Psychomotor Activity  Psychomotor Activity: Psychomotor Activity: Normal  Assets  Assets: Communication Skills; Desire for Improvement; Social Support  Sleep  Sleep: Sleep: Poor  Physical Exam: See H&P. Blood pressure 117/87, pulse 78, temperature 98.5 F (36.9 C), temperature source Oral, resp. rate 18, height 5\' 8"  (1.727 m), weight 78 kg, SpO2 98 %. Body mass index is 26.15 kg/m.  COGNITIVE FEATURES THAT CONTRIBUTE TO RISK:  Closed-mindedness, Polarized thinking, and Thought constriction (tunnel vision)    SUICIDE RISK:   Moderate:  Frequent suicidal ideation with limited intensity, and duration, some specificity in terms of plans, no associated intent, good self-control, limited dysphoria/symptomatology, some risk factors present, and identifiable protective factors, including available and accessible social support.  PLAN OF CARE: See H&P.  I certify that inpatient services furnished can reasonably be expected to improve the patient's condition.   Lindell Spar, NP, pmhnp, fnp-bc. 03/22/2022, 8:47 AM

## 2022-03-22 NOTE — Group Note (Unsigned)
Date:  03/22/2022 Time:  5:14 PM  Group Topic/Focus:  Wellness Toolbox:   The focus of this group is to discuss various aspects of wellness, balancing those aspects and exploring ways to increase the ability to experience wellness.  Patients will create a wellness toolbox for use upon discharge.     Participation Level:  {BHH PARTICIPATION RPRXY:58592}  Participation Quality:  {BHH PARTICIPATION QUALITY:22265}  Affect:  {BHH AFFECT:22266}  Cognitive:  {BHH COGNITIVE:22267}  Insight: {BHH Insight2:20797}  Engagement in Group:  {BHH ENGAGEMENT IN TWKMQ:28638}  Modes of Intervention:  {BHH MODES OF INTERVENTION:22269}  Additional Comments:  ***  Jerrye Beavers 03/22/2022, 5:14 PM

## 2022-03-22 NOTE — BH IP Treatment Plan (Signed)
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[place]) Active Problems:   Suicide attempt by hanging, initial encounter ([place])   Current Medications:  Current Facility-Administered Medications  Medication Dose Route Frequency Provider Last Rate Last Admin   acetaminophen (TYLENOL) tablet 650 mg  650 mg Oral Q6H PRN Onuoha, Chinwendu V, NP       alum & mag hydroxide-simeth (MAALOX/MYLANTA) 200-200-20 MG/5ML suspension 30 mL  30 mL Oral Q4H PRN Onuoha, Chinwendu V, NP       citalopram (CELEXA) tablet 20 mg  20 mg Oral Daily Onuoha, Chinwendu V, NP       hydrOXYzine (ATARAX) tablet 25 mg  25 mg Oral TID PRN Onuoha, Chinwendu V, NP       magnesium hydroxide (MILK OF MAGNESIA) suspension 30 mL  30 mL Oral Daily PRN Onuoha, Chinwendu V, NP       traZODone (DESYREL) tablet 50 mg  50 mg Oral QHS PRN Onuoha, Chinwendu V, NP       PTA Medications: No medications prior to admission.    Patient Stressors: Financial difficulties   Marital or family conflict    Patient Strengths: Motivation for treatment/growth  Religious Affiliation  Supportive family/friends  Work skills   Treatment Modalities: Medication Management, Group therapy, Case management,  1 to 1 session with clinician, Psychoeducation, Recreational therapy.   Physician Treatment Plan for Primary Diagnosis: Major depressive disorder, single episode, severe without psychosis ([place]) Long Term Goal(s): Improvement in symptoms so as ready for discharge   Short Term Goals: Ability to identify and develop effective coping behaviors will improve Ability to maintain clinical measurements within normal limits will improve Compliance with prescribed medications  will improve Ability to identify triggers associated with substance abuse/mental health issues will improve Ability to identify changes in lifestyle to reduce recurrence of condition will improve Ability to verbalize feelings will improve Ability to disclose and discuss suicidal ideas Ability to demonstrate self-control will improve  Medication Management: Evaluate patient's response, side effects, and tolerance of medication regimen.  Therapeutic Interventions: 1 to 1 sessions, Unit Group sessions and Medication administration.  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[place]) Active Problems:   Suicide attempt by hanging, initial encounter ([place])  Long Term Goal(s): Improvement in symptoms so as ready for discharge   Short Term Goals: Ability to identify and develop effective coping behaviors will improve Ability to maintain clinical measurements within normal limits will improve Compliance with prescribed medications will improve Ability to identify triggers associated with substance abuse/mental health issues will improve Ability to identify changes in lifestyle to reduce recurrence of condition will improve Ability to verbalize feelings will improve Ability to disclose and discuss suicidal ideas Ability to demonstrate self-control will improve     Medication Management: Evaluate patient's response, side effects, and tolerance of medication regimen.  Therapeutic Interventions: 1 to 1 sessions, Unit Group sessions and Medication administration.  Evaluation of Outcomes: Not Met   RN Treatment Plan for Primary Diagnosis: Major depressive disorder, single episode, severe without psychosis ([place]) Long Term Goal(s): Knowledge of disease and therapeutic regimen to maintain health will improve  Short Term Goals: Ability to remain free from injury will  improve,  Ability to verbalize frustration and anger appropriately will improve, Ability to demonstrate self-control, Ability to participate in decision making will improve, Ability to verbalize feelings will improve, Ability to disclose and discuss suicidal ideas, Ability to identify and develop effective coping behaviors will improve, and Compliance with prescribed medications will improve  Medication Management: RN will administer medications as ordered by provider, will assess and evaluate patient's response and provide education to patient for prescribed medication. RN will report any adverse and/or side effects to prescribing provider.  Therapeutic Interventions: 1 on 1 counseling sessions, Psychoeducation, Medication administration, Evaluate responses to treatment, Monitor vital signs and CBGs as ordered, Perform/monitor CIWA, COWS, AIMS and Fall Risk screenings as ordered, Perform wound care treatments as ordered.  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  Short Term Goals: Engage patient in aftercare planning with referrals and resources  Therapeutic Interventions: Assess for all discharge needs, 1 to 1 time with Social worker, Explore available resources and support systems, Assess for adequacy in community support network, Educate family and significant other(s) on suicide prevention, Complete Psychosocial Assessment, Interpersonal group therapy.  Evaluation of Outcomes: Not Progressing   Progress in Treatment: Attending groups: No. Participating in groups: No. Taking medication as prescribed: Yes. Toleration medication: Yes. Family/Significant other contact made: No, will contact:  Gwenlyn Perking and Deloris Harriston  [phone] (Grandparents)  Patient understands diagnosis: Yes. Discussing patient identified problems/goals with staff: Yes. Medical problems stabilized or resolved: Yes. Denies suicidal/homicidal ideation: Yes. Issues/concerns per patient self-inventory: No.   New problem(s) identified: No, Describe:  None reported   New Short Term/Long Term Goal(s): medication stabilization, elimination of SI thoughts, development of comprehensive mental wellness plan.    Patient Goals:  "Get back home"   Discharge Plan or Barriers: Patient recently admitted. CSW will continue to follow and assess for appropriate referrals and possible discharge planning.    Reason for Continuation of Hospitalization: Anxiety Depression Medication stabilization Suicidal ideation   Estimated Length of Stay: 3-5 days   Last 3 [place] Suicide Severity Risk Score: [place] Admission (Current) from 03/22/2022 in [place] 400[facility] ED from 03/21/2022 in [facility] ED from 02/04/2022 in [facility] Emergency Department at [place] High Risk High Risk No Risk       Last [facility] 2/9 Scores:     No data to display          Scribe for Treatment Team: Charlett Lango 03/22/2022 1:18 PM

## 2022-03-22 NOTE — BHH Counselor (Signed)
Adult Comprehensive Assessment  Patient ID: Jacob Wolf, male   DOB: 22-Oct-1995, 27 y.o.   MRN: [id number]  Information Source: Information source: Patient  Current Stressors:  Patient states their primary concerns and needs for treatment are:: "Depression, anxiety, and suicidal thoughts" Patient states their goals for this hospitilization and ongoing recovery are:: "To get back home" Educational / Learning stressors: Pt reports having a 12th grade education Employment / Job issues: Pt reports working at [employer]: Pt reports some conflict with his mother Museum/gallery curator / Lack of resources (include bankruptcy): Pt reports no stressors Housing / Lack of housing: Pt reports living with his wife and 50month old daughter Physical health (include injuries & life threatening diseases): Pt reports no stressors Social relationships: Pt reports having few social supports Substance abuse: Pt reports drinking wine occassionally Bereavement / Loss: Pt reports no stressors  Living/Environment/Situation:  Living Arrangements: Spouse/significant other, Children Living conditions (as described by patient or guardian): House/[place] Who else lives in the home?: Wife and 36 month old daughter  Family History:  Marital status: Married Number of Years Married: 1 What types of issues is patient dealing with in the relationship?: "A lack of communication and trust issues" Are you sexually active?: Yes What is your sexual orientation?: Heterosexual Has your sexual activity been affected by drugs, alcohol, medication, or emotional stress?: No How many children?: 1 How is patient's relationship with their children?: "I have a 67 month old daughter and we get along great"  Childhood History:  By whom was/is the patient raised?: Mother, Grandparents Additional childhood history information: Pt reports having visitation with his father during childhood Description of patient's  relationship with caregiver when they were a child: "We had regular relationships" Patient's description of current relationship with people who raised him/her: "It's alright, I am great with my grandmother but not so good with my mother" How were you disciplined when you got in trouble as a child/adolescent?: Spankings and Groundings Does patient have siblings?: Yes Number of Siblings: 5 Description of patient's current relationship with siblings: "I have 2 sisters and 3 brothers and we all get along good" Did patient suffer any verbal/emotional/physical/sexual abuse as a child?: No Did patient suffer from severe childhood neglect?: No Has patient ever been sexually abused/assaulted/raped as an adolescent or adult?: No Was the patient ever a victim of a crime or a disaster?: No Witnessed domestic violence?: No Has patient been affected by domestic violence as an adult?: No  Education:  Highest grade of school patient has completed: 12th grade Currently a student?: No Learning disability?: No  Employment/Work Situation:   Employment Situation: Employed 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[employer] How Long has Patient Been Employed?: 2 months Are You Satisfied With Your Job?: Yes Do You Work More Than One Job?: No Work Stressors: Pt did not report any stressors at this time Patient's Job has Been Impacted by Current Illness: No What is the Longest Time Patient has Held a Job?: 1 year 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" Has Patient ever Been in the [employer]?: No  Financial Resources:   Financial resources: Income from employment, Physicist, medical, Multimedia programmer Does patient have a representative payee or guardian?: No  Alcohol/Substance Abuse:   What has been your use of drugs/alcohol within the last 12 months?: Pt reports drinking wine occasionally If attempted suicide, did drugs/alcohol play a role in this?: No Alcohol/Substance Abuse Treatment Hx:  Denies past history Has alcohol/substance abuse ever caused  legal problems?: No  Social Support System:   Heritage manager System: Poor Describe Community Support System: "A friend and my wife" Type of faith/religion: "Christian" How does patient's faith help to cope with current illness?: "Prayer"  Leisure/Recreation:   Do You Have Hobbies?: Yes Leisure and Hobbies: Development worker, community"  Strengths/Needs:   What is the patient's perception of their strengths?: "Compassion and Determination" Patient states they can use these personal strengths during their treatment to contribute to their recovery: "I can stay focused" Patient states these barriers may affect/interfere with their treatment: None Patient states these barriers may affect their return to the community: None Other important information patient would like considered in planning for their treatment: None  Discharge Plan:   Currently receiving community mental health services: No Patient states concerns and preferences for aftercare planning are: Pt is intersted in therapy and medication management services in the [place] area Patient states they will know when they are safe and ready for discharge when: "I know I am ready to leave now" Does patient have access to transportation?: Yes (Family car at home) Does patient have financial barriers related to discharge medications?: No Will patient be returning to same living situation after discharge?: Yes  Summary/Recommendations:   Summary and Recommendations (to be completed by the evaluator): Jacob Wolf is a 27 year old, male who was admitted to the hospital due to worsening depression, anxiety, and suicidal thoughts.  The Pt reports that he and his wife have been discussing a divorce and "that led to my suicide attempt".  He states that he currently lives with his wife and 7 month old daughter.  He states that he is close with his grandmother but not as close  with his mother.  He reports having a good relationship with his 5 siblings as well.  The Pt denies any childhood abuse or trauma.  He reports having a 12th grade education and working full-time at [employer].  He states that he receives Physicist, medical and [employer] from his employment.  The Pt reports drinking wine occasionally but denies any other form of substance use.  He also denies any current or previous substance use treatment. While in the hospital the Pt can benefit from crisis stabilization, medication evaluation, group therapy, psycho-education, case management, and discharge planning. Upon discharge the Pt would like to return home with his wife. It is recommended that the Pt follow-up with a local outpatient provider for therapy and medication management. It is also recommended that the Pt continue to take all medications as prescribed until directed to do otherwise by his providers.  Darleen Crocker. 03/22/2022

## 2022-03-22 NOTE — Group Note (Signed)
Recreation Therapy Group Note   Group Topic:Team Building  Group Date: 03/22/2022 Start Time: 0935 End Time: 1020 Facilitators: Jillyn Stacey-McCall, LRT,CTRS Location: [address] Dayroom   Goal Area(s) Addresses:  Patient will effectively work with peer towards shared goal.  Patient will identify skills used to make activity successful.  Patient will identify how skills used during activity can be used to reach post d/c goals.   Group Description: Straw Bridge. In teams of 3-5, patients were given 15 plastic drinking straws and an equal length of masking tape. Using the materials provided, patients were instructed to build a free standing bridge-like structure to suspend an everyday item (ex: puzzle box) off of the floor or table surface. All materials were required to be used by the team in their design. LRT facilitated post-activity discussion reviewing team process. Patients were encouraged to reflect how the skills used in this activity can be generalized to daily life post discharge.   Affect/Mood: N/A   Participation Level: Did not attend    Clinical Observations/Individualized Feedback:     Plan: Continue to engage patient in RT group sessions 2-3x/week.   Charissa Knowles-McCall, LRT,CTRS 03/22/2022 1:28 PM

## 2022-03-22 NOTE — Progress Notes (Signed)
Initial Treatment Plan 03/22/2022 4:03 AM Jacob C Kubitz DGL:[id number]    PATIENT STRESSORS: Financial difficulties   Marital or family conflict     PATIENT STRENGTHS: Motivation for treatment/growth  Religious Affiliation  Supportive family/friends  Work skills    PATIENT IDENTIFIED PROBLEMS: Marital issues  Financial problems  "Going through divorce"  "I want to get back home"  "It's hard to express"             DISCHARGE CRITERIA:  Ability to meet basic life and health needs Adequate post-discharge living arrangements Improved stabilization in mood, thinking, and/or behavior Reduction of life-threatening or endangering symptoms to within safe limits  PRELIMINARY DISCHARGE PLAN: Outpatient therapy Return to previous living arrangement Return to previous work or school arrangements  PATIENT/FAMILY INVOLVEMENT: This treatment plan has been presented to and reviewed with the patient, Jacob Wolf, and/or family member. The patient and family have been given the opportunity to ask questions and make suggestions.  Ernest Pine, RN 03/22/2022, 4:03 AM

## 2022-03-23 DIAGNOSIS — F332 Major depressive disorder, recurrent severe without psychotic features: Secondary | ICD-10-CM | POA: Diagnosis not present

## 2022-03-23 NOTE — Plan of Care (Signed)
  Problem: Education: Goal: Ability to make informed decisions regarding treatment will improve Outcome: Progressing   Problem: Coping: Goal: Coping ability will improve Outcome: Progressing   Problem: Health Behavior/Discharge Planning: Goal: Identification of resources available to assist in meeting health care needs will improve Outcome: Progressing   Problem: Medication: Goal: Compliance with prescribed medication regimen will improve Outcome: Progressing   Problem: Self-Concept: Goal: Ability to disclose and discuss suicidal ideas will improve Outcome: Progressing

## 2022-03-23 NOTE — [facility] Group Notes (Signed)
.  Psychoeducational Group Note    Date:  03/23/2022 Time:1300-1400    Purpose of Group: . The group focus' on teaching patients on how to identify their needs and their Life Skills:  A group where two lists are made. What people need and what are things that we do that are unhealthy. The lists are developed by the patients and it is explained that we often do the actions that are not healthy to get our list of needs met.  Goal:: to develop the coping skills needed to get their needs met  Participation Level:  did not attend Adeline Petitfrere A     

## 2022-03-23 NOTE — Progress Notes (Signed)
   03/22/22 2130  Psych Admission Type (Psych Patients Only)  Admission Status Voluntary  Psychosocial Assessment  Patient Complaints Anxiety;Irritability  Eye Contact Brief  Facial Expression Sad;Flat  Affect Depressed;Sad  Speech Logical/coherent  Interaction Cautious;Guarded;Minimal  Motor Activity Slow  Appearance/Hygiene Unremarkable  Behavior Characteristics Appropriate to situation;Cooperative  Mood Depressed;Pleasant  Thought Process  Coherency WDL  Content WDL  Delusions None reported or observed  Perception WDL  Hallucination None reported or observed  Judgment Impaired  Confusion None  Danger to Self  Current suicidal ideation? Denies  Agreement Not to Harm Self Yes  Description of Agreement Verbal contract  Danger to Others  Danger to Others None reported or observed

## 2022-03-23 NOTE — [facility] Group Notes (Signed)
Group Focus: affirmation, clarity of thought, and goals/reality orientation Treatment Modality:  Psychoeducation Interventions utilized were assignment, group exercise, and support Purpose: To be able to understand and verbalize the reason for their admission to the hospital. To understand that the medication helps with their chemical imbalance but they also need to work on their choices in life. To be challenged to develop Wolf list of 30 positives about themselves. Also introduce the concept that "feelings" are not reality.  Participation Level:did not attend  Jacob Wolf 

## 2022-03-23 NOTE — Progress Notes (Signed)
[facility] MD Progress Note  03/23/2022 3:56 PM Jacob Wolf  MRN:  [id number]  Reason for admission: 27 year old AA male with previous hx of mental illness. Jacob was a patient in this [facility] in October of 2016 with complaint of worsening symptoms of depression triggering suicidal ideations with some ideas to hang himself. He described his feelings then as wanting to die. The trigger at the time was relationship break-up. He was treated, stabilized & discharged on Citalopram. Jacob is being admitted to the [facility] this time with complain of worsening depression/suicidal ideation with an attempt to hang himself with a belt. This again was triggered by relationship problems as well. Chart review indicated that patient & wife were talking divorce, he saw on his wife's phone that she has already started talking to someone else   Daily notes: Jacob is seen, chart reviewed. The chart findings discussed with the treatment team. He presents alert, oriented & aware of situation. He is visible on the unit, attending group sessions. He says, I slept well last night. I have no symptoms of depression or anxiety. I did take my medicine this morning, no side effects. I have been attending group sessions & it is going well. Well, when you guys can call my wife Jacob Wolf, if she will pickup the phone, that will be fine. Everything is going ok between [place]. We are cool. She is currently in [place] visiting. It is her birthday". This provider attempted to call patient's mother Jacob Wolf for collateral information, the phone number listed is not going through. Jacob currently denies any SIHI, AVH, delusional thoughts or paranoia. He does not appear to be responding to any internal stimuli. There are currently no changes made on the current plan of care, will continue as already in progress.   Principal Problem: Major depressive disorder, recurrent episode, severe (HCC)  Diagnosis: Principal Problem:   Major depressive disorder,  recurrent episode, severe (HCC) Active Problems:   Major depressive disorder, single episode, severe without psychosis (HCC)   Suicide attempt by hanging, initial encounter (HCC)  Total Time spent with patient:  35 minutes  Past Psychiatric History: See H&P  Past Medical History:  Past Medical History:  Diagnosis Date   Asthma    Asthma, mild intermittent 10/29/2011   Concussion     Past Surgical History:  Procedure Laterality Date   APPENDECTOMY     LAPAROSCOPIC APPENDECTOMY  04/16/2011   Procedure: APPENDECTOMY LAPAROSCOPIC;  Surgeon: Judie Petit. Leonia Corona, MD;  Location: [facility] OR;  Service: Pediatrics;  Laterality: N/A;   Lt toe removal     Family History: History reviewed. No pertinent family history.  Family Psychiatric  History: See H&P  Social History:  Social History   Substance and Sexual Activity  Alcohol Use No     Social History   Substance and Sexual Activity  Drug Use No    Social History   Socioeconomic History   Marital status: Married    Spouse name: Not on file   Number of children: Not on file   Years of education: Not on file   Highest education level: Not on file  Occupational History   Not on file  Tobacco Use   Smoking status: Never   Smokeless tobacco: Never  Vaping Use   Vaping Use: Never used  Substance and Sexual Activity   Alcohol use: No   Drug use: No   Sexual activity: Yes  Other Topics Concern   Not on file  Social History  Narrative   [employer]   10 th grade.         Social Determinants of Health   Financial Resource Strain: Not on file  Food Insecurity: Food Insecurity Present (03/22/2022)   Hunger Vital Sign    Worried About Running Out of Food in the Last Year: Patient refused    Ran Out of Food in the Last Year: Sometimes true  Transportation Needs: No Transportation Needs (03/22/2022)   PRAPARE - Administrator, Civil ServiceTransportation    Lack of Transportation (Medical): No    Lack of Transportation (Non-Medical): No  Physical Activity: Not  on file  Stress: Not on file  Social Connections: Not on file   Additional Social History:   Sleep: Good  Appetite:  Good  Current Medications: Current Facility-Administered Medications  Medication Dose Route Frequency Provider Last Rate Last Admin   acetaminophen (TYLENOL) tablet 650 mg  650 mg Oral Q6H PRN Onuoha, Chinwendu V, NP       alum & mag hydroxide-simeth (MAALOX/MYLANTA) 200-200-20 MG/5ML suspension 30 mL  30 mL Oral Q4H PRN Onuoha, Chinwendu V, NP       citalopram (CELEXA) tablet 20 mg  20 mg Oral Daily Onuoha, Chinwendu V, NP   20 mg at 03/23/22 0810   hydrOXYzine (ATARAX) tablet 25 mg  25 mg Oral TID PRN Onuoha, Chinwendu V, NP   25 mg at 03/22/22 2126   magnesium hydroxide (MILK OF MAGNESIA) suspension 30 mL  30 mL Oral Daily PRN Onuoha, Chinwendu V, NP       traZODone (DESYREL) tablet 50 mg  50 mg Oral QHS PRN Onuoha, Chinwendu V, NP       Lab Results:  Results for orders placed or performed during the hospital encounter of 03/21/22 (from the past 48 hour(s))  Resp panel by RT-PCR (RSV, Flu A&B, Covid) Anterior Nasal Swab     Status: None   Collection Time: 03/21/22  8:42 PM   Specimen: Anterior Nasal Swab  Result Value Ref Range   SARS Coronavirus 2 by RT PCR NEGATIVE NEGATIVE    Comment: (NOTE) SARS-CoV-2 target nucleic acids are NOT DETECTED.  The SARS-CoV-2 RNA is generally detectable in upper respiratory specimens during the acute phase of infection. The lowest concentration of SARS-CoV-2 viral copies this assay can detect is 138 copies/mL. A negative result does not preclude SARS-Cov-2 infection and should not be used as the sole basis for treatment or other patient management decisions. A negative result may occur with  improper specimen collection/handling, submission of specimen other than nasopharyngeal swab, presence of viral mutation(s) within the areas targeted by this assay, and inadequate number of viral copies(<138 copies/mL). A negative result  must be combined with clinical observations, patient history, and epidemiological information. The expected result is Negative.  Fact Sheet for Patients:  BloggerCourse.comhttps://www.fda.gov/media/152166/download  Fact Sheet for Healthcare Providers:  SeriousBroker.ithttps://www.fda.gov/media/152162/download  This test is no t yet approved or cleared by the [place] FDA and  has been authorized for detection and/or diagnosis of SARS-CoV-2 by FDA under an Emergency Use Authorization (EUA). This EUA will remain  in effect (meaning this test can be used) for the duration of the COVID-19 declaration under Section 564(b)(1) of the Act, 21 U.S.C.section 360bbb-3(b)(1), unless the authorization is terminated  or revoked sooner.       Influenza A by PCR NEGATIVE NEGATIVE   Influenza B by PCR NEGATIVE NEGATIVE    Comment: (NOTE) The Xpert Xpress SARS-CoV-2/FLU/RSV plus assay is intended as an aid in the diagnosis  of influenza from Nasopharyngeal swab specimens and should not be used as a sole basis for treatment. Nasal washings and aspirates are unacceptable for Xpert Xpress SARS-CoV-2/FLU/RSV testing.  Fact Sheet for Patients: EntrepreneurPulse.com.au  Fact Sheet for Healthcare Providers: IncredibleEmployment.be  This test is not yet approved or cleared by the [place] FDA and has been authorized for detection and/or diagnosis of SARS-CoV-2 by FDA under an Emergency Use Authorization (EUA). This EUA will remain in effect (meaning this test can be used) for the duration of the COVID-19 declaration under Section 564(b)(1) of the Act, 21 U.S.C. section 360bbb-3(b)(1), unless the authorization is terminated or revoked.     Resp Syncytial Virus by PCR NEGATIVE NEGATIVE    Comment: (NOTE) Fact Sheet for Patients: EntrepreneurPulse.com.au  Fact Sheet for Healthcare Providers: IncredibleEmployment.be  This test is not yet approved or  cleared by the [place] FDA and has been authorized for detection and/or diagnosis of SARS-CoV-2 by FDA under an Emergency Use Authorization (EUA). This EUA will remain in effect (meaning this test can be used) for the duration of the COVID-19 declaration under Section 564(b)(1) of the Act, 21 U.S.C. section 360bbb-3(b)(1), unless the authorization is terminated or revoked.  Performed at [facility], [place] [address] [place], [place] [address]   CBC with Differential/Platelet     Status: None   Collection Time: 03/21/22  8:43 PM  Result Value Ref Range   WBC 5.7 4.0 - 10.5 K/uL   RBC 5.52 4.22 - 5.81 MIL/uL   Hemoglobin 16.8 13.0 - 17.0 g/dL   HCT 48.2 39.0 - 52.0 %   MCV 87.3 80.0 - 100.0 fL   MCH 30.4 26.0 - 34.0 pg   MCHC 34.9 30.0 - 36.0 g/dL   RDW 12.3 11.5 - 15.5 %   Platelets 312 150 - 400 K/uL   nRBC 0.0 0.0 - 0.2 %   Neutrophils Relative % 60 %   Neutro Abs 3.4 1.7 - 7.7 K/uL   Lymphocytes Relative 33 %   Lymphs Abs 1.9 0.7 - 4.0 K/uL   Monocytes Relative 7 %   Monocytes Absolute 0.4 0.1 - 1.0 K/uL   Eosinophils Relative 0 %   Eosinophils Absolute 0.0 0.0 - 0.5 K/uL   Basophils Relative 0 %   Basophils Absolute 0.0 0.0 - 0.1 K/uL   Immature Granulocytes 0 %   Abs Immature Granulocytes 0.02 0.00 - 0.07 K/uL    Comment: Performed at [facility], [address], [place],  [address]  Comprehensive metabolic panel     Status: Abnormal   Collection Time: 03/21/22  8:43 PM  Result Value Ref Range   Sodium 139 135 - 145 mmol/L   Potassium 4.0 3.5 - 5.1 mmol/L   Chloride 101 98 - 111 mmol/L   CO2 28 22 - 32 mmol/L   Glucose, Bld 104 (H) 70 - 99 mg/dL    Comment: Glucose reference range applies only to samples taken after fasting for at least 8 hours.   BUN 9 6 - 20 mg/dL   Creatinine, Ser 0.95 0.61 - 1.24 mg/dL   Calcium 9.6 8.9 - 10.3 mg/dL   Total Protein 7.6 6.5 - 8.1 g/dL   Albumin 4.4 3.5 - 5.0 g/dL   AST 24 15 - 41 U/L   ALT 32 0 - 44  U/L   Alkaline Phosphatase 54 38 - 126 U/L   Total Bilirubin 0.8 0.3 - 1.2 mg/dL   GFR, Estimated >60 >60  mL/min    Comment: (NOTE) Calculated using the CKD-EPI Creatinine Equation (2021)    Anion gap 10 5 - 15    Comment: Performed at [facility], [address] [place], [place] [address]  Hemoglobin A1c     Status: None   Collection Time: 03/21/22  8:43 PM  Result Value Ref Range   Hgb A1c MFr Bld 5.2 4.8 - 5.6 %    Comment: (NOTE) Pre diabetes:          5.7%-6.4%  Diabetes:              >6.4%  Glycemic control for   <7.0% adults with diabetes    Mean Plasma Glucose 102.54 mg/dL    Comment: Performed at [facility], [address] [place], [place] [address]  Lipid panel     Status: Abnormal   Collection Time: 03/21/22  8:43 PM  Result Value Ref Range   Cholesterol 190 0 - 200 mg/dL   Triglycerides 68 <280 mg/dL   HDL 48 >03 mg/dL   Total CHOL/HDL Ratio 4.0 RATIO   VLDL 14 0 - 40 mg/dL   LDL Cholesterol 491 (H) 0 - 99 mg/dL    Comment:        Total Cholesterol/HDL:CHD Risk Coronary Heart Disease Risk Table                     Men   Women  1/2 Average Risk   3.4   3.3  Average Risk       5.0   4.4  2 X Average Risk   9.6   7.1  3 X Average Risk  23.4   11.0        Use the calculated Patient Ratio above and the CHD Risk Table to determine the patient's CHD Risk.        ATP III CLASSIFICATION (LDL):  <100     mg/dL   Optimal  791-505  mg/dL   Near or Above                    Optimal  130-159  mg/dL   Borderline  697-948  mg/dL   High  >016     mg/dL   Very High Performed at [facility], [address] [place], [place] [address]   TSH     Status: None   Collection Time: 03/21/22  8:43 PM  Result Value Ref Range   TSH 1.075 0.350 - 4.500 uIU/mL    Comment: Performed by a 3rd Generation assay with a functional sensitivity of <=0.01 uIU/mL. Performed at [facility], [address], [place], [place] [address]   POC SARS Coronavirus  2 Ag     Status: None   Collection Time: 03/21/22  8:57 PM  Result Value Ref Range   SARSCOV2ONAVIRUS 2 AG NEGATIVE NEGATIVE    Comment: (NOTE) SARS-CoV-2 antigen NOT DETECTED.   Negative results are presumptive.  Negative results do not preclude SARS-CoV-2 infection and should not be used as the sole basis for treatment or other patient management decisions, including infection  control decisions, particularly in the presence of clinical signs and  symptoms consistent with COVID-19, or in those who have been in contact with the virus.  Negative results must be combined with clinical observations, patient history, and epidemiological information. The expected result is Negative.  Fact Sheet for Patients: https://www.jennings-kim.com/  Fact Sheet for Healthcare Providers: https://alexander-rogers.biz/  This test is not yet approved or cleared by the [place] and  has been authorized for detection and/or diagnosis of SARS-CoV-2 by FDA under an Emergency Use Authorization (EUA).  This EUA will remain in effect (meaning this test can be used) for the duration of  the COV ID-19 declaration under Section 564(b)(1) of the Act, 21 U.S.C. section 360bbb-3(b)(1), unless the authorization is terminated or revoked sooner.    POCT Urine Drug Screen - (I-Screen)     Status: None (Preliminary result)   Collection Time: 03/21/22  9:23 PM  Result Value Ref Range   POC Amphetamine UR None Detected NONE DETECTED (Cut Off Level 1000 ng/mL)   POC Secobarbital (BAR) None Detected NONE DETECTED (Cut Off Level 300 ng/mL)   POC Buprenorphine (BUP) None Detected NONE DETECTED (Cut Off Level 10 ng/mL)   POC Oxazepam (BZO) None Detected NONE DETECTED (Cut Off Level 300 ng/mL)   POC Cocaine UR None Detected NONE DETECTED (Cut Off Level 300 ng/mL)   POC Methamphetamine UR None Detected NONE DETECTED (Cut Off Level 1000 ng/mL)   POC Morphine None Detected NONE DETECTED (Cut  Off Level 300 ng/mL)   POC Methadone UR None Detected NONE DETECTED (Cut Off Level 300 ng/mL)   POC Oxycodone UR None Detected NONE DETECTED (Cut Off Level 100 ng/mL)   POC Marijuana UR None Detected NONE DETECTED (Cut Off Level 50 ng/mL)   Blood Alcohol level:  Lab Results  Component Value Date   ETH <5 12/04/2014   Metabolic Disorder Labs: Lab Results  Component Value Date   HGBA1C 5.2 03/21/2022   MPG 102.54 03/21/2022   No results found for: "PROLACTIN" Lab Results  Component Value Date   CHOL 190 03/21/2022   TRIG 68 03/21/2022   HDL 48 03/21/2022   CHOLHDL 4.0 03/21/2022   VLDL 14 03/21/2022   LDLCALC 128 (H) 03/21/2022   Physical Findings: AIMS: Facial and Oral Movements Muscles of Facial Expression: None, normal Lips and Perioral Area: None, normal Jaw: None, normal Tongue: None, normal,Extremity Movements Upper (arms, wrists, hands, fingers): None, normal Lower (legs, knees, ankles, toes): None, normal, Trunk Movements Neck, shoulders, hips: None, normal, Overall Severity Severity of abnormal movements (highest score from questions above): None, normal Incapacitation due to abnormal movements: None, normal Patient's awareness of abnormal movements (rate only patient's report): No Awareness, Dental Status Current problems with teeth and/or dentures?: No Does patient usually wear dentures?: No  CIWA:    COWS:     Musculoskeletal: Strength & Muscle Tone: within normal limits Gait & Station: normal Patient leans: N/A  Psychiatric Specialty Exam:  Presentation  General Appearance:  Appropriate for Environment; Casual; Fairly Groomed  Eye Contact: Good  Speech: Clear and Coherent; Normal Rate  Speech Volume: Normal  Handedness: Right   Mood and Affect  Mood: -- ("Improving".)  Affect: Congruent   Thought Process  Thought Processes: Coherent; Goal Directed; Linear  Descriptions of Associations:Intact  Orientation:Full (Time, Place and  Person)  Thought Content:Logical  History of Schizophrenia/Schizoaffective disorder:No  Duration of Psychotic Symptoms:No data recorded Hallucinations:Hallucinations: None  Ideas of Reference:None  Suicidal Thoughts:Suicidal Thoughts: No  Homicidal Thoughts:Homicidal Thoughts: No   Sensorium  Memory: Immediate Good; Recent Good; Remote Good  Judgment: Good  Insight: Fair  Executive Functions  Concentration: Good  Attention Span: Good  Recall: Good  Fund of Knowledge: Fair  Language: Good  Psychomotor Activity  Psychomotor Activity:Psychomotor Activity: Normal   Assets  Assets: Communication Skills; Desire for Improvement; Financial  Resources/Insurance; Housing; Resilience; Social Support  Sleep  Sleep:Sleep: Good Number of Hours of Sleep: 7   Physical Exam: Physical Exam Vitals and nursing note reviewed.  HENT:     Head: Normocephalic.     Nose: Nose normal.  Eyes:     Pupils: Pupils are equal, round, and reactive to light.  Cardiovascular:     Rate and Rhythm: Normal rate.     Pulses: Normal pulses.  Pulmonary:     Effort: Pulmonary effort is normal.  Genitourinary:    Comments: Deferred Musculoskeletal:        General: Normal range of motion.     Cervical back: Normal range of motion.  Skin:    General: Skin is warm and dry.  Neurological:     General: No focal deficit present.     Mental Status: He is alert and oriented to person, place, and time.    Review of Systems  Constitutional:  Negative for chills, diaphoresis and fever.  HENT:  Negative for congestion and sore throat.   Eyes:  Negative for blurred vision.  Respiratory:  Negative for cough, shortness of breath and wheezing.   Cardiovascular:  Negative for chest pain and palpitations.  Gastrointestinal:  Negative for abdominal pain, constipation, diarrhea, heartburn, nausea and vomiting.  Genitourinary:  Negative for dysuria.  Musculoskeletal:  Negative for joint pain  and myalgias.  Skin:  Negative for itching and rash.  Neurological:  Negative for dizziness, tingling, tremors, sensory change, speech change, focal weakness, seizures, loss of consciousness, weakness and headaches.  Endo/Heme/Allergies:        Allergies: NKDA  Psychiatric/Behavioral:  Positive for depression. Negative for hallucinations, memory loss, substance abuse and suicidal ideas. The patient is not nervous/anxious and does not have insomnia.    Blood pressure 132/83, pulse 90, temperature 98.4 F (36.9 C), temperature source Oral, resp. rate 18, height 5\' 8"  (1.727 m), weight 78 kg, SpO2 98 %. Body mass index is 26.15 kg/m.  Treatment Plan Summary: Daily contact with patient to assess and evaluate symptoms and progress in treatment and Medication management.   Continue inpatient hospitalization.  Will continue today 03/23/2022 plan as below except where it is noted.    Principal/active diagnoses.  Major depressive disorder, recurrent episode, severe (HCC)  Plan:  -Continue Citalopram 20 mg po daily for depression. -Continue Hydroxyzine 25 mg po tid prn for anxiety.  -Continue Trazodone 50 mg po Q bedtime prn for insomnia.   Other PRNS -Continue Tylenol 650 mg every 6 hours PRN for mild pain -Continue Maalox 30 ml Q 4 hrs PRN for indigestion -Continue MOM 30 ml po Q 6 hrs for constipation   Safety and Monitoring: Voluntary admission to inpatient psychiatric unit for safety, stabilization and treatment Daily contact with patient to assess and evaluate symptoms and progress in treatment Patient's case to be discussed in multi-disciplinary team meeting Observation Level : q15 minute checks Vital signs: q12 hours Precautions: Safety   Discharge Planning: Social work and case management to assist with discharge planning and identification of hospital follow-up needs prior to discharge Estimated LOS: 5-7 days Discharge Concerns: Need to establish a safety plan; Medication  compliance and effectiveness Discharge Goals: Return home with outpatient referrals for mental health follow-up including medication management/psychotherapy  Lindell Spar, NP, pmhnp, fnp-bc. 03/23/2022, 3:56 PM

## 2022-03-23 NOTE — Progress Notes (Signed)
D: Patient alert and oriented, able to make needs known. Denies SI/HI, AVH at present. Denies pain at present. Patient goal today "go home, be active and social" Rates depression 2/10, hopelessness 0/10, and anxiety 0/10. Patient reports energy level as normal. He reports he slept good last night. Patient does not request any PRN medication at this time.   A: Scheduled medications administered to patient per MD order. Support and encouragement provided. Routine safety checks conducted every fifteen minutes. Patient informed to notify staff with problems or concerns. Frequent verbal contact made.   R: No adverse drug reactions noted. Patient contracts for safety at this time. Patient is compliant with medications and treatment plan. Patient receptive, calm and cooperative. Patient interacts with others appropriately on unit at present. Patient remains safe at present.

## 2022-03-23 NOTE — Progress Notes (Signed)
[facility] Group Notes:  (Nursing/MHT/Case Management/Adjunct)  Date:  03/23/2022  Time:  2000  Type of Therapy:   wrap up group  Participation Level:  Active  Participation Quality:  Appropriate, Attentive, Sharing, and Supportive  Affect:  Anxious  Cognitive:  Alert  Insight:  Improving  Engagement in Group:  Engaged  Modes of Intervention:  Clarification, Education, and Support  Summary of Progress/Problems: Positive thinking and positive change were discussed.   Shellia Cleverly 03/23/2022, 9:12 PM

## 2022-03-23 NOTE — Group Note (Signed)
LCSW Group Therapy Note   No social work group was held; the following was provided to each patient  in lieu of in-person group:  Healthy vs. 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

## 2022-03-23 NOTE — Plan of Care (Signed)
  Problem: Coping: Goal: Coping ability will improve Outcome: Progressing   Problem: Health Behavior/Discharge Planning: Goal: Identification of resources available to assist in meeting health care needs will improve Outcome: Progressing   Problem: Self-Concept: Goal: Ability to disclose and discuss suicidal ideas will improve Outcome: Progressing   Problem: Coping: Goal: Coping ability will improve Outcome: Progressing

## 2022-03-24 DIAGNOSIS — F332 Major depressive disorder, recurrent severe without psychotic features: Secondary | ICD-10-CM | POA: Diagnosis not present

## 2022-03-24 NOTE — Progress Notes (Signed)
[facility] MD Progress Note  03/24/2022 8:53 AM Jacob Wolf  MRN:  [id number]  Reason for admission: 27 year old AA male with previous hx of mental illness. Jacob was a patient in this [facility] in October of 2016 with complaint of worsening symptoms of depression triggering suicidal ideations with some ideas to hang himself. He described his feelings then as wanting to die. The trigger at the time was relationship break-up. He was treated, stabilized & discharged on Citalopram. Jacob is being admitted to the [facility] this time with complain of worsening depression/suicidal ideation with an attempt to hang himself with a belt. This again was triggered by relationship problems as well. Chart review indicated that patient & wife were talking divorce, he saw on his wife's phone that she has already started talking to someone else   Daily notes: Jacob is seen, chart reviewed. The chart findings discussed with the treatment team. He presents alert with an improved affect, good eye contact & verbally responsive. He reports, "I'm doing very well. Doing well on my medicine. No side effects to report. I have been attending group sessions. I would like to talk to to the Dr. Doctor to start discussing discharge plan". This provider informed patient that he can ask any questions he may have about discharge. He says he feels he feels better & denies any symptoms of depression. Expressed to the patient that we have not been able to get hold of his wife or his mother for collateral information & for safety plan prior to discharge. Patient replied, "you can call my wife again. She is currently in [place], but we are trying to work things out between [place].  Collateral information obtained from patient's wife, Jacob Wolf [phone]. She reports, "Jacob is in the hospital because he tried to commit suicide the other day. He wrapped a belt around his neck very tight when we were having an argument. Jacob has a temper, has dark thoughts about not  wanting to be here no more & he can be impatient. We have been married for 1 year, but has agreed to get a divorce because we are not getting along lately. When we made the decision to divorce, I started talking to someone else, Jacob found out, we got into an argument & that was when he tried to hurt himself. At this point, I'm not sure if I want to get back with him. I need sometime to process stuff while he is getting some help. I would not want him to be in the hospital too long, but I need him to get the help he needs. After he gets discharge, he can go to the apartment we shared together, but I'm not sure if I'm coming back there. I need sometime. I have known Jacob for 4 years & married for one year".  Principal Problem: Major depressive disorder, recurrent episode, severe (HCC)  Diagnosis: Principal Problem:   Major depressive disorder, recurrent episode, severe (HCC) Active Problems:   Major depressive disorder, single episode, severe without psychosis ([place])   Suicide attempt by hanging, initial encounter ([place])  Total Time spent with patient:  35 minutes  Past Psychiatric History: See H&P  Past Medical History:  Past Medical History:  Diagnosis Date   Asthma    Asthma, mild intermittent 10/29/2011   Concussion     Past Surgical History:  Procedure Laterality Date   APPENDECTOMY     LAPAROSCOPIC APPENDECTOMY  04/16/2011   Procedure: APPENDECTOMY LAPAROSCOPIC;  Surgeon: Jerilynn Mages. Jon Gills Alcide Goodness,  MD;  Location: [facility] OR;  Service: Pediatrics;  Laterality: N/A;   Lt toe removal     Family History: History reviewed. No pertinent family history.  Family Psychiatric  History: See H&P  Social History:  Social History   Substance and Sexual Activity  Alcohol Use No     Social History   Substance and Sexual Activity  Drug Use No    Social History   Socioeconomic History   Marital status: Married    Spouse name: Not on file   Number of children: Not on file   Years of education:  Not on file   Highest education level: Not on file  Occupational History   Not on file  Tobacco Use   Smoking status: Never   Smokeless tobacco: Never  Vaping Use   Vaping Use: Never used  Substance and Sexual Activity   Alcohol use: No   Drug use: No   Sexual activity: Yes  Other Topics Concern   Not on file  Social History Narrative   [employer]   10 th grade.         Social Determinants of Health   Financial Resource Strain: Not on file  Food Insecurity: Food Insecurity Present (03/22/2022)   Hunger Vital Sign    Worried About Running Out of Food in the Last Year: Patient refused    Ran Out of Food in the Last Year: Sometimes true  Transportation Needs: No Transportation Needs (03/22/2022)   PRAPARE - Administrator, Civil Service (Medical): No    Lack of Transportation (Non-Medical): No  Physical Activity: Not on file  Stress: Not on file  Social Connections: Not on file   Additional Social History:   Sleep: Good  Appetite:  Good  Current Medications: Current Facility-Administered Medications  Medication Dose Route Frequency Provider Last Rate Last Admin   acetaminophen (TYLENOL) tablet 650 mg  650 mg Oral Q6H PRN Onuoha, Chinwendu V, NP       alum & mag hydroxide-simeth (MAALOX/MYLANTA) 200-200-20 MG/5ML suspension 30 mL  30 mL Oral Q4H PRN Onuoha, Chinwendu V, NP       citalopram (CELEXA) tablet 20 mg  20 mg Oral Daily Onuoha, Chinwendu V, NP   20 mg at 03/23/22 0810   hydrOXYzine (ATARAX) tablet 25 mg  25 mg Oral TID PRN Onuoha, Chinwendu V, NP   25 mg at 03/23/22 2029   magnesium hydroxide (MILK OF MAGNESIA) suspension 30 mL  30 mL Oral Daily PRN Onuoha, Chinwendu V, NP       traZODone (DESYREL) tablet 50 mg  50 mg Oral QHS PRN Onuoha, Chinwendu V, NP       Lab Results:  No results found for this or any previous visit (from the past 48 hour(s)).  Blood Alcohol level:  Lab Results  Component Value Date   ETH <5 12/04/2014   Metabolic Disorder  Labs: Lab Results  Component Value Date   HGBA1C 5.2 03/21/2022   MPG 102.54 03/21/2022   No results found for: "PROLACTIN" Lab Results  Component Value Date   CHOL 190 03/21/2022   TRIG 68 03/21/2022   HDL 48 03/21/2022   CHOLHDL 4.0 03/21/2022   VLDL 14 03/21/2022   LDLCALC 128 (H) 03/21/2022   Physical Findings: AIMS: Facial and Oral Movements Muscles of Facial Expression: None, normal Lips and Perioral Area: None, normal Jaw: None, normal Tongue: None, normal,Extremity Movements Upper (arms, wrists, hands, fingers): None, normal Lower (legs, knees, ankles, toes): None,  normal, Trunk Movements Neck, shoulders, hips: None, normal, Overall Severity Severity of abnormal movements (highest score from questions above): None, normal Incapacitation due to abnormal movements: None, normal Patient's awareness of abnormal movements (rate only patient's report): No Awareness, Dental Status Current problems with teeth and/or dentures?: No Does patient usually wear dentures?: No  CIWA:    COWS:     Musculoskeletal: Strength & Muscle Tone: within normal limits Gait & Station: normal Patient leans: N/A  Psychiatric Specialty Exam:  Presentation  General Appearance:  Appropriate for Environment; Casual; Fairly Groomed  Eye Contact: Good  Speech: Clear and Coherent; Normal Rate  Speech Volume: Normal  Handedness: Right   Mood and Affect  Mood: -- ("Improving".)  Affect: Congruent   Thought Process  Thought Processes: Coherent; Goal Directed; Linear  Descriptions of Associations:Intact  Orientation:Full (Time, Place and Person)  Thought Content:Logical  History of Schizophrenia/Schizoaffective disorder:No  Duration of Psychotic Symptoms:No data recorded Hallucinations:Hallucinations: None  Ideas of Reference:None  Suicidal Thoughts:Suicidal Thoughts: No  Homicidal Thoughts:Homicidal Thoughts: No   Sensorium  Memory: Immediate Good; Recent  Good; Remote Good  Judgment: Good  Insight: Fair  Executive Functions  Concentration: Good  Attention Span: Good  Recall: Good  Fund of Knowledge: Fair  Language: Good  Psychomotor Activity  Psychomotor Activity:Psychomotor Activity: Normal   Assets  Assets: Communication Skills; Desire for Improvement; Financial Resources/Insurance; Housing; Resilience; Social Support  Sleep  Sleep:Sleep: Good Number of Hours of Sleep: 7   Physical Exam: Physical Exam Vitals and nursing note reviewed.  HENT:     Head: Normocephalic.     Nose: Nose normal.  Eyes:     Pupils: Pupils are equal, round, and reactive to light.  Cardiovascular:     Rate and Rhythm: Normal rate.     Pulses: Normal pulses.  Pulmonary:     Effort: Pulmonary effort is normal.  Genitourinary:    Comments: Deferred Musculoskeletal:        General: Normal range of motion.     Cervical back: Normal range of motion.  Skin:    General: Skin is warm and dry.  Neurological:     General: No focal deficit present.     Mental Status: He is alert and oriented to person, place, and time.    Review of Systems  Constitutional:  Negative for chills, diaphoresis and fever.  HENT:  Negative for congestion and sore throat.   Eyes:  Negative for blurred vision.  Respiratory:  Negative for cough, shortness of breath and wheezing.   Cardiovascular:  Negative for chest pain and palpitations.  Gastrointestinal:  Negative for abdominal pain, constipation, diarrhea, heartburn, nausea and vomiting.  Genitourinary:  Negative for dysuria.  Musculoskeletal:  Negative for joint pain and myalgias.  Skin:  Negative for itching and rash.  Neurological:  Negative for dizziness, tingling, tremors, sensory change, speech change, focal weakness, seizures, loss of consciousness, weakness and headaches.  Endo/Heme/Allergies:        Allergies: NKDA  Psychiatric/Behavioral:  Positive for depression. Negative for  hallucinations, memory loss, substance abuse and suicidal ideas. The patient is not nervous/anxious and does not have insomnia.    Blood pressure 128/86, pulse 100, temperature 98.1 F (36.7 C), temperature source Oral, resp. rate 16, height 5\' 8"  (1.727 m), weight 78 kg, SpO2 95 %. Body mass index is 26.15 kg/m.  Treatment Plan Summary: Daily contact with patient to assess and evaluate symptoms and progress in treatment and Medication management.   Continue inpatient hospitalization.  Will  continue today 03/24/2022 plan as below except where it is noted.    Principal/active diagnoses.  Major depressive disorder, recurrent episode, severe (HCC)  Plan:  -Continue Citalopram 20 mg po daily for depression. -Continue Hydroxyzine 25 mg po tid prn for anxiety.  -Continue Trazodone 50 mg po Q bedtime prn for insomnia.   Other PRNS -Continue Tylenol 650 mg every 6 hours PRN for mild pain -Continue Maalox 30 ml Q 4 hrs PRN for indigestion -Continue MOM 30 ml po Q 6 hrs for constipation   Safety and Monitoring: Voluntary admission to inpatient psychiatric unit for safety, stabilization and treatment Daily contact with patient to assess and evaluate symptoms and progress in treatment Patient's case to be discussed in multi-disciplinary team meeting Observation Level : q15 minute checks Vital signs: q12 hours Precautions: Safety   Discharge Planning: Social work and case management to assist with discharge planning and identification of hospital follow-up needs prior to discharge Estimated LOS: 5-7 days Discharge Concerns: Need to establish a safety plan; Medication compliance and effectiveness Discharge Goals: Return home with outpatient referrals for mental health follow-up including medication management/psychotherapy  Lindell Spar, NP, pmhnp, fnp-bc. 03/24/2022, 8:53 AMPatient ID: Jacob Wolf, male   DOB: 04-08-1995, 27 y.o.   MRN: [id number]

## 2022-03-24 NOTE — Plan of Care (Signed)
  Problem: Education: Goal: Ability to make informed decisions regarding treatment will improve Outcome: Progressing   Problem: Coping: Goal: Coping ability will improve Outcome: Progressing   Problem: Self-Concept: Goal: Ability to disclose and discuss suicidal ideas will improve Outcome: Progressing   Problem: Self-Concept: Goal: Will verbalize positive feelings about self Outcome: Progressing

## 2022-03-24 NOTE — Group Note (Signed)
LCSW Group Therapy Note   Group Date: 03/24/2022 Start Time: 1443 End Time: 1145  Type of Therapy and Topic:  Group Therapy:  Communication  Participation Level:  Active   Description of Group:    In this group patients will be encouraged to explore how individuals communicate with one another appropriately and inappropriately. Patients will be guided to discuss their thoughts, feelings, and behaviors related to barriers communicating feelings, needs, and stressors. The group will process together ways to execute positive and appropriate communications, with attention given to how one use behavior, tone, and body language to communicate. Patient will be encouraged to reflect on an incident where they were successfully able to communicate and the factors that they believe helped them to communicate. Each patient will be encouraged to identify specific changes they are motivated to make in order to overcome communication barriers with self, peers, authority, and parents. This group will be process-oriented, with patients participating in exploration of their own experiences as well as giving and receiving support and challenging self as well as other group members.  Therapeutic Goals: Patient will identify how people communicate (body language, facial expression, and electronics) Also discuss tone, voice and how these impact what is communicated and how the message is perceived.  Patient will identify feelings (such as fear or worry), thought process and behaviors related to why people internalize feelings rather than express self openly. Patient will identify two changes they are willing to make to overcome communication barriers. Members will then practice through Role Play how to communicate by utilizing psycho-education material (such as I Feel statements and acknowledging feelings rather than displacing on others)   Summary of Patient Progress  Patient came to group 20 minutes in . Patient  said he would follow along by listening rather than taking the provided worksheet. Patient provided good insight on how he would respond to the communication practice questions. Patient was respectful at times but would start having a side conversation with his peer. Patient was attentive throughout group when CSW would speak or ask questions. Patient remain in group the entire session.    Therapeutic Modalities:   Cognitive Behavioral Therapy Solution Focused Therapy Motivational Interviewing Family Systems Approach    Sherre Lain, [place] 03/24/2022  2:26 PM

## 2022-03-24 NOTE — [facility] Group Notes (Signed)
Adult Psychoeducational Group  Date:  03/24/2022 Time: 1300-1400  Group Topic/Focus: Continuation of the group from Saturday. Looking at the lists that were created and talking about what needs to be done with the homework of 30 positives about themselves.                                     Talking about taking their power back and helping themselves to develop a positive self esteem.      Participation Quality:  Appropriate  Affect:  Appropriate  Cognitive:  Oriented  Insight: Improving  Engagement in Group:  Engaged  Modes of Intervention:  Activity, Discussion, Education, and Support  Additional Comments:  Rates energy at a 6/10/ Participated in the group.  Paulino Rily

## 2022-03-24 NOTE — Progress Notes (Signed)
   03/24/22 1100  Psych Admission Type (Psych Patients Only)  Admission Status Voluntary  Psychosocial Assessment  Patient Complaints Worrying;Sadness  Eye Contact Fair  Facial Expression Sad  Affect Depressed  Speech Logical/coherent  Interaction Forwards little  Motor Activity Slow  Appearance/Hygiene Unremarkable  Behavior Characteristics Cooperative;Appropriate to situation  Mood Pleasant;Sad;Depressed  Thought Process  Coherency WDL  Content Blaming others  Delusions None reported or observed  Perception WDL  Hallucination None reported or observed  Judgment Impaired  Confusion None  Danger to Self  Current suicidal ideation? Denies  Self-Injurious Behavior No self-injurious ideation or behavior indicators observed or expressed   Agreement Not to Harm Self Yes  Danger to Others  Danger to Others None reported or observed

## 2022-03-24 NOTE — Progress Notes (Signed)
Patient alert and oriented x 4, patient cooperative with care, patient observed interacts with other patients and staff on unit appropriately. Patient attended evening group, patient mood was "pretty good" goal was to "socialize and eat."  Patient rated his anxiety 3/10, depression 2/10. Patient denies pain, SI, HI, and AVH.   PRN Hydroxyzine administered for anxiety per MD orders and patient's request. Patient remains calm and pleasant, support and encouragement provided, patient verbally contracts for safety, no physical symptoms reported. Q 15 minutes safety observations ongoing, patient remains safe in room and on unit.

## 2022-03-24 NOTE — Progress Notes (Incomplete)
The focus of this group is to help patients review their daily goal of treatment and discuss progress on daily workbooks.   

## 2022-03-25 DIAGNOSIS — F332 Major depressive disorder, recurrent severe without psychotic features: Secondary | ICD-10-CM | POA: Diagnosis not present

## 2022-03-25 NOTE — Plan of Care (Signed)
  Problem: Education: Goal: Ability to make informed decisions regarding treatment will improve 03/25/2022 1056 by Dorris Carnes, RN Outcome: Progressing 03/25/2022 1056 by Dorris Carnes, RN Outcome: Progressing   Problem: Health Behavior/Discharge Planning: Goal: Identification of resources available to assist in meeting health care needs will improve 03/25/2022 1056 by Dorris Carnes, RN Outcome: Progressing 03/25/2022 1056 by Dorris Carnes, RN Outcome: Progressing   Problem: Medication: Goal: Compliance with prescribed medication regimen will improve 03/25/2022 1056 by Dorris Carnes, RN Outcome: Progressing 03/25/2022 1056 by Dorris Carnes, RN Outcome: Progressing

## 2022-03-25 NOTE — Progress Notes (Signed)
   03/25/22 0800  Psych Admission Type (Psych Patients Only)  Admission Status Voluntary  Psychosocial Assessment  Patient Complaints Sadness;Depression  Eye Contact Fair  Facial Expression Sullen  Affect Depressed  Speech Logical/coherent  Interaction Cautious;Guarded;Forwards little  Motor Activity Slow  Appearance/Hygiene Unremarkable  Behavior Characteristics Cooperative;Appropriate to situation  Mood Depressed;Pleasant  Thought Process  Coherency WDL  Content Blaming others  Delusions None reported or observed  Perception WDL  Hallucination None reported or observed  Judgment Impaired  Confusion None  Danger to Self  Current suicidal ideation? Denies  Self-Injurious Behavior No self-injurious ideation or behavior indicators observed or expressed   Agreement Not to Harm Self Yes  Description of Agreement verbal  Danger to Others  Danger to Others None reported or observed

## 2022-03-25 NOTE — B[facility] Counselor (Signed)
CSW spoke with the Pt about discharge planning.  The Pt states that he will be going to his grandparents after discharge and will arrange a time and place with his wife to gather his belongings from their shared home after discharge.  He states that his wife has been out of town on vacation and that "may be why she hasn't answered".  CSW has spoken with the Pt's grandparents who agree that he can come to their home after discharge.

## 2022-03-25 NOTE — Progress Notes (Signed)
   03/24/22 2000  Psych Admission Type (Psych Patients Only)  Admission Status Voluntary  Psychosocial Assessment  Patient Complaints Sadness;Worrying  Eye Contact Fair  Facial Expression Sullen  Affect Depressed  Speech Logical/coherent  Interaction Cautious;Forwards little  Motor Activity Slow  Appearance/Hygiene Unremarkable  Behavior Characteristics Cooperative;Appropriate to situation  Mood Depressed;Pleasant;Sad  Thought Process  Coherency WDL  Content Blaming others  Delusions None reported or observed  Perception WDL  Hallucination None reported or observed  Judgment Impaired  Confusion None  Danger to Self  Current suicidal ideation? Denies (Denies)  Agreement Not to Harm Self Yes  Description of Agreement verbal  Danger to Others  Danger to Others None reported or observed   Pt attending all scheduled programming interacting well with Peers and Staff appear to be minimizing his symptoms. Denies SI/HI/A/VH and verbally contracted for safety. Q 15 minutes safety checks ongoing.

## 2022-03-25 NOTE — Plan of Care (Signed)
  Problem: Coping: Goal: Coping ability will improve Outcome: Progressing   Problem: Self-Concept: Goal: Will verbalize positive feelings about self Outcome: Progressing   Problem: Coping: Goal: Coping ability will improve Outcome: Progressing   Problem: Self-Concept: Goal: Level of anxiety will decrease Outcome: Progressing

## 2022-03-25 NOTE — Progress Notes (Signed)
[facility] MD Progress Note  03/25/2022 1:50 PM Jacob Wolf  MRN:  [id number]  Reason for admission: 27 year old AA male with previous hx of mental illness. Jacob was a patient in this [facility] in October of 2016 with complaint of worsening symptoms of depression triggering suicidal ideations with some ideas to hang himself. He described his feelings then as wanting to die. The trigger at the time was relationship break-up. He was treated, stabilized & discharged on Citalopram. Jacob is being admitted to the [facility] this time with complain of worsening depression/suicidal ideation with an attempt to hang himself with a belt. This again was triggered by relationship problems as well. Chart review indicated that patient & wife were talking divorce, he saw on his wife's phone that she has already started talking to someone else   Today's notes: Jacob is seen and examined in [address].  The chart findings discussed with the treatment team and shared with the attending psychiatrist. He presents alert with an improved affect, good eye contact & verbally responsive. He reports, "my mood is better, the Celexa is helping me."  As needed of hydroxyzine administered yesterday at 2125 for anxiety.  Affect is congruent.  Denies adverse effects to psychotropic medication. I have been attending group sessions.  Observe patient attending and participating in groups and therapeutic milieu.  Patient reports that he plans to be discharged to home with his grandparents.  When asked about the wife, he provided her phone number for collateral.  Patient does not appear to be responding to internal or external stimuli.  No delusional thinking or paranoia observed.  Denies symptoms of depression or anxiety.  Labs and vital signs reviewed without critical values.   Collateral information: Per patient permission, Jacob Wolf patient's spouse called at [phone]. She reports, "Jacob is in the hospital because he tried to commit suicide the other  day. He wrapped a belt around his neck very tight when we were having an argument. Jacob has a temper, has dark thoughts about not wanting to be here no more & he can be impatient. We have been married for 1 year, but has agreed to get a divorce because we are not getting along lately. When we made the decision to divorce, I started talking to someone else, Jacob found out, we got into an argument & that was when he tried to hurt himself."  Jacob Wolf added that they have a 46-month-old daughter.  That patient can return home after being discharged from the hospital rather than go to the grandparent.  However, patient's spouse did not state if she would stay in the relationship.  And patient is aware of this conversation.   Principal Problem: Major depressive disorder, recurrent episode, severe (HCC)  Diagnosis: Principal Problem:   Major depressive disorder, recurrent episode, severe (HCC) Active Problems:   Major depressive disorder, single episode, severe without psychosis (HCC)   Suicide attempt by hanging, initial encounter (HCC)  Total Time spent with patient:  35 minutes  Past Psychiatric History: See H&P  Past Medical History:  Past Medical History:  Diagnosis Date   Asthma    Asthma, mild intermittent 10/29/2011   Concussion     Past Surgical History:  Procedure Laterality Date   APPENDECTOMY     LAPAROSCOPIC APPENDECTOMY  04/16/2011   Procedure: APPENDECTOMY LAPAROSCOPIC;  Surgeon: Judie Petit. Leonia Corona, MD;  Location: [facility] OR;  Service: Pediatrics;  Laterality: N/A;   Lt toe removal     Family History: History reviewed. No  pertinent family history.  Family Psychiatric  History: See H&P  Social History:  Social History   Substance and Sexual Activity  Alcohol Use No     Social History   Substance and Sexual Activity  Drug Use No    Social History   Socioeconomic History   Marital status: Married    Spouse name: Not on file   Number of children: Not on file   Years of  education: Not on file   Highest education level: Not on file  Occupational History   Not on file  Tobacco Use   Smoking status: Never   Smokeless tobacco: Never  Vaping Use   Vaping Use: Never used  Substance and Sexual Activity   Alcohol use: No   Drug use: No   Sexual activity: Yes  Other Topics Concern   Not on file  Social History Narrative   [employer]   10 th grade.         Social Determinants of Health   Financial Resource Strain: Not on file  Food Insecurity: Food Insecurity Present (03/22/2022)   Hunger Vital Sign    Worried About Running Out of Food in the Last Year: Patient refused    [place] in the Last Year: Sometimes true  Transportation Needs: No Transportation Needs (03/22/2022)   PRAPARE - Hydrologist (Medical): No    Lack of Transportation (Non-Medical): No  Physical Activity: Not on file  Stress: Not on file  Social Connections: Not on file   Additional Social History:   Sleep: Good  Appetite:  Good  Current Medications: Current Facility-Administered Medications  Medication Dose Route Frequency Provider Last Rate Last Admin   acetaminophen (TYLENOL) tablet 650 mg  650 mg Oral Q6H PRN Onuoha, Chinwendu V, NP       alum & mag hydroxide-simeth (MAALOX/MYLANTA) 200-200-20 MG/5ML suspension 30 mL  30 mL Oral Q4H PRN Onuoha, Chinwendu V, NP       citalopram (CELEXA) tablet 20 mg  20 mg Oral Daily Onuoha, Chinwendu V, NP   20 mg at 03/25/22 0819   hydrOXYzine (ATARAX) tablet 25 mg  25 mg Oral TID PRN Onuoha, Chinwendu V, NP   25 mg at 03/24/22 2125   magnesium hydroxide (MILK OF MAGNESIA) suspension 30 mL  30 mL Oral Daily PRN Onuoha, Chinwendu V, NP       traZODone (DESYREL) tablet 50 mg  50 mg Oral QHS PRN Onuoha, Chinwendu V, NP       Lab Results:  No results found for this or any previous visit (from the past 48 hour(s)).  Blood Alcohol level:  Lab Results  Component Value Date   ETH <5 29/93/7169    Metabolic Disorder Labs: Lab Results  Component Value Date   HGBA1C 5.2 03/21/2022   MPG 102.54 03/21/2022   No results found for: "PROLACTIN" Lab Results  Component Value Date   CHOL 190 03/21/2022   TRIG 68 03/21/2022   HDL 48 03/21/2022   CHOLHDL 4.0 03/21/2022   VLDL 14 03/21/2022   LDLCALC 128 (H) 03/21/2022   Physical Findings: AIMS: Facial and Oral Movements Muscles of Facial Expression: None, normal Lips and Perioral Area: None, normal Jaw: None, normal Tongue: None, normal,Extremity Movements Upper (arms, wrists, hands, fingers): None, normal Lower (legs, knees, ankles, toes): None, normal, Trunk Movements Neck, shoulders, hips: None, normal, Overall Severity Severity of abnormal movements (highest score from questions above): None, normal Incapacitation due to abnormal  movements: None, normal Patient's awareness of abnormal movements (rate only patient's report): No Awareness, Dental Status Current problems with teeth and/or dentures?: No Does patient usually wear dentures?: No  CIWA:    COWS:     Musculoskeletal: Strength & Muscle Tone: within normal limits Gait & Station: normal Patient leans: N/A  Psychiatric Specialty Exam:  Presentation  General Appearance:  Appropriate for Environment; Casual; Fairly Groomed  Eye Contact: Good  Speech: Clear and Coherent; Normal Rate  Speech Volume: Normal  Handedness: Right  Mood and Affect  Mood: -- (State my mood is better)  Affect: Congruent  Thought Process  Thought Processes: Coherent; Goal Directed  Descriptions of Associations:Intact  Orientation:Full (Time, Place and Person)  Thought Content:Logical  History of Schizophrenia/Schizoaffective disorder:No  Duration of Psychotic Symptoms:No data recorded Hallucinations:Hallucinations: None  Ideas of Reference:None  Suicidal Thoughts:Suicidal Thoughts: No  Homicidal Thoughts:Homicidal Thoughts: No  Sensorium   Memory: Immediate Good; Recent Good  Judgment: Fair  Insight: Fair  Art therapist  Concentration: Good  Attention Span: Good  Recall: Fair  Fund of Knowledge: Fair  Language: Good  Psychomotor Activity  Psychomotor Activity:Psychomotor Activity: Normal  Assets  Assets: Communication Skills; Desire for Improvement; Housing; Resilience; Social Support  Sleep  Sleep:Sleep: Good Number of Hours of Sleep: 8  Physical Exam: Physical Exam Vitals and nursing note reviewed.  HENT:     Head: Normocephalic.     Nose: Nose normal.  Eyes:     Pupils: Pupils are equal, round, and reactive to light.  Cardiovascular:     Rate and Rhythm: Normal rate.     Pulses: Normal pulses.  Pulmonary:     Effort: Pulmonary effort is normal.  Genitourinary:    Comments: Deferred Musculoskeletal:        General: Normal range of motion.     Cervical back: Normal range of motion.  Skin:    General: Skin is warm and dry.  Neurological:     General: No focal deficit present.     Mental Status: He is alert and oriented to person, place, and time.  Psychiatric:        Behavior: Behavior normal.    Review of Systems  Constitutional:  Negative for chills, diaphoresis and fever.  HENT:  Negative for congestion and sore throat.   Eyes:  Negative for blurred vision.  Respiratory:  Negative for cough, shortness of breath and wheezing.   Cardiovascular:  Negative for chest pain and palpitations.  Gastrointestinal:  Negative for abdominal pain, constipation, diarrhea, heartburn, nausea and vomiting.  Genitourinary:  Negative for dysuria.  Musculoskeletal:  Negative for joint pain and myalgias.  Skin:  Negative for itching and rash.  Neurological:  Negative for dizziness, tingling, tremors, sensory change, speech change, focal weakness, seizures, loss of consciousness, weakness and headaches.  Endo/Heme/Allergies:        Allergies: NKDA  Psychiatric/Behavioral:  Positive for  depression. Negative for hallucinations, memory loss, substance abuse and suicidal ideas. The patient is not nervous/anxious and does not have insomnia.    Blood pressure 114/81, pulse 97, temperature 98.4 F (36.9 C), temperature source Oral, resp. rate 16, height 5\' 8"  (1.727 m), weight 78 kg, SpO2 97 %. Body mass index is 26.15 kg/m.  Treatment Plan Summary: Daily contact with patient to assess and evaluate symptoms and progress in treatment and Medication management.   Continue inpatient hospitalization.  Will continue today 03/25/2022 plan as below except where it is noted.    Principal/active diagnoses.  Major depressive  disorder, recurrent episode, severe (HCC)  Plan:  -Continue Citalopram 20 mg po daily for depression. -Continue Hydroxyzine 25 mg po tid prn for anxiety.  -Continue Trazodone 50 mg po Q bedtime prn for insomnia.   Other PRNS -Continue Tylenol 650 mg every 6 hours PRN for mild pain -Continue Maalox 30 ml Q 4 hrs PRN for indigestion -Continue MOM 30 ml po Q 6 hrs for constipation   Safety and Monitoring: Voluntary admission to inpatient psychiatric unit for safety, stabilization and treatment Daily contact with patient to assess and evaluate symptoms and progress in treatment Patient's case to be discussed in multi-disciplinary team meeting Observation Level : q15 minute checks Vital signs: q12 hours Precautions: Safety   Discharge Planning: Social work and case management to assist with discharge planning and identification of hospital follow-up needs prior to discharge Estimated LOS: 5-7 days Discharge Concerns: Need to establish a safety plan; Medication compliance and effectiveness Discharge Goals: Return home with outpatient referrals for mental health follow-up including medication management/psychotherapy  Laretta Bolster, FNP. 03/25/2022, 1:50 PMPatient ID: Jacob Wolf, male   DOB: Jul 31, 1995, 27 y.o.   MRN: [id number] Patient ID: Jacob Wolf, male    DOB: Nov 22, 1995, 27 y.o.   MRN: [id number]

## 2022-03-25 NOTE — BHH Group Notes (Signed)
Spiritual care group on grief and loss facilitated by chaplain Tarick Parenteau Andree Elk and Lysle Morales, counseling intern.   Group Goal: Support / Education around grief and loss  Members engage in facilitated group support and psycho-social education.  Group Description:  Following introductions and group rules, group members engaged in facilitated group dialogue and support around topic of loss, with particular support around experiences of loss in their lives. Group Identified types of loss (relationships / self / things) and identified patterns, circumstances, and changes that precipitate losses. Reflected on thoughts / feelings around loss, normalized grief responses, and recognized variety in grief experience. Group encouraged individual reflection on safe space and on the coping skills that they are already utilizing.   Group drew on Adlerian / Rogerian and narrative framework  Patient Progress: Jacob Wolf actively participated in group.  Though he did not complete his grief and loss packet, he was apart of discussion.  Jacob Wolf also supported peer who had a medical event.  Jacob Wolf, however, did not hold awareness around the ways he offered physical support and care without consent to a peer who was actively grieving.

## 2022-03-25 NOTE — BHH Suicide Risk Assessment (Signed)
[place] INPATIENT:  Family/Significant Other Suicide Prevention Education  Suicide Prevention Education:  Education Completed; Deloris Harriston [phone] (Grandmother) has been identified by the patient as the family member/significant other with whom the patient will be residing, and identified as the person(s) who will aid the patient in the event of a mental health crisis (suicidal ideations/suicide attempt).  With written consent from the patient, the family member/significant other has been provided the following suicide prevention education, prior to the and/or following the discharge of the patient.  The suicide prevention education provided includes the following: Suicide risk factors Suicide prevention and interventions National Suicide Hotline telephone number [facility] assessment telephone number [facility] Emergency Assistance [place] and/or Residential Mobile Crisis Unit telephone number  Request made of family/significant other to: Remove weapons (e.g., guns, rifles, knives), all items previously/currently identified as safety concern.   Remove drugs/medications (over-the-counter, prescriptions, illicit drugs), all items previously/currently identified as a safety concern.  The family member/significant other verbalizes understanding of the suicide prevention education information provided.  The family member/significant other agrees to remove the items of safety concern listed above.  CSW spoke with Mrs. Harriston who states that there have been some difficulties between their grandson and his wife.  She states that her grandson's wife has been out of town in [place] visiting with family.  She states that her grandson's wife is scheduled to return to [place] today.  Mrs. Jarvis Morgan states that her grandson can come to live with her after discharge.  She states that there are no firearms or weapons on her house or her grandson's house.  CSW completed SPE  with Mrs. Harriston.    Frutoso Chase Sher Shampine 03/25/2022, 1:18 PM

## 2022-03-26 DIAGNOSIS — F332 Major depressive disorder, recurrent severe without psychotic features: Secondary | ICD-10-CM | POA: Diagnosis not present

## 2022-03-26 NOTE — Progress Notes (Signed)
   03/25/22 2100  Psych Admission Type (Psych Patients Only)  Admission Status Voluntary  Psychosocial Assessment  Patient Complaints Anxiety  Eye Contact Fair  Facial Expression Blank  Affect Depressed  Speech Logical/coherent  Interaction Cautious;Guarded  Motor Activity Slow  Appearance/Hygiene Unremarkable  Behavior Characteristics Appropriate to situation;Cooperative  Mood Depressed;Pleasant  Thought Process  Coherency WDL  Content WDL  Delusions None reported or observed  Perception WDL  Hallucination None reported or observed  Judgment Impaired  Confusion None  Danger to Self  Current suicidal ideation? Denies  Self-Injurious Behavior No self-injurious ideation or behavior indicators observed or expressed   Agreement Not to Harm Self Yes  Description of Agreement Verbal  Danger to Others  Danger to Others None reported or observed

## 2022-03-26 NOTE — Group Note (Signed)
LCSW Group Therapy Note   Group Date: 03/26/2022 Start Time: 1100 End Time: 1200  Type of Therapy and Topic:  Group Therapy:  Strengths Exploration   Participation Level: Did Not Attend  Description of Group: This group allows individuals to explore their strengths, learn to use strengths in new ways to improve well-being. Strengths-based interventions involve identifying strengths, understanding how they are used, and learning new ways to apply them. Individuals will identify their strengths, and then explore their roles in different areas of life (relationships, professional life, and personal fulfillment). Individuals will think about ways in which they currently use their strengths, along with new ways they could begin using them.    Therapeutic Goals Patient will verbalize two of their strengths. Patient will identify how their strengths are currently used. Patient will identify two new ways to apply their strengths. Patients will create a plan to apply their strengths in their daily lives.     Summary of Patient Progress:  Did not attend       Therapeutic Modalities Cognitive Behavioral Therapy Motivational Interviewing  Ilyse Tremain M Heriberto Stmartin, LCSWA 03/26/2022  1:09 PM    

## 2022-03-26 NOTE — Progress Notes (Signed)
Patient rated his day as a 6 out of 10 because he got upset over some "fake information". The patient did not go into further detail. His goal for tomorrow is to get discharged.

## 2022-03-26 NOTE — Progress Notes (Signed)
Adult Psychoeducational Group Note  Date:  03/26/2022 Time:  11:21 AM  Group Topic/Focus:  Goals Group:   The focus of this group is to help patients establish daily goals to achieve during treatment and discuss how the patient can incorporate goal setting into their daily lives to aide in recovery.  Participation Level:  Did Not Attend  Participation Quality:   N/A  Affect:   N/A  Cognitive:   N/A  Insight: None  Engagement in Group:  N/A  Modes of Intervention:   N/A  Additional Comments:  N/A  Major 03/26/2022, 11:21 AM

## 2022-03-26 NOTE — Progress Notes (Signed)
D: Patient is alert, oriented, anxious, and cooperative. Denies SI, HI, AVH, and verbally contracts for safety. He reports frustration due to being here and not being able to see his daughter.    A: Scheduled medications administered per MD order. Support provided. Patient educated on safety on the unit and medications. Routine safety checks every 15 minutes. Patient stated understanding to tell nurse about any new physical symptoms. Patient understands to tell staff of any needs.     R: No adverse drug reactions noted. Patient verbally contracts for safety. Patient remains safe at this time and will continue to monitor.    03/26/22 0800  Psych Admission Type (Psych Patients Only)  Admission Status Voluntary  Psychosocial Assessment  Patient Complaints None  Eye Contact Fair  Facial Expression Blank  Affect Depressed  Speech Logical/coherent  Interaction Cautious;Guarded;Minimal  Motor Activity Slow  Appearance/Hygiene Unremarkable  Behavior Characteristics Appropriate to situation;Cooperative  Mood Depressed;Pleasant  Thought Process  Coherency WDL  Content WDL  Delusions None reported or observed  Perception WDL  Hallucination None reported or observed  Judgment Impaired  Confusion None  Danger to Self  Current suicidal ideation? Denies  Self-Injurious Behavior No self-injurious ideation or behavior indicators observed or expressed   Agreement Not to Harm Self Yes  Description of Agreement verbal  Danger to Others  Danger to Others None reported or observed

## 2022-03-26 NOTE — Progress Notes (Signed)
[facility] MD Progress Note  03/26/2022 12:56 PM Jacob Wolf C Kinnard  MRN:  [id number]  Reason for admission: 27 year old AA male with previous hx of mental illness. Jacob Wolf was a patient in this [facility] in October of 2016 with complaint of worsening symptoms of depression triggering suicidal ideations with some ideas to hang himself. He described his feelings then as wanting to die. The trigger at the time was relationship break-up. He was treated, stabilized & discharged on Citalopram. Jacob Wolf is being admitted to the [facility] this time with complain of worsening depression/suicidal ideation with an attempt to hang himself with a belt. This again was triggered by relationship problems as well. Chart review indicated that patient & wife were talking divorce, he saw on his wife's phone that she has already started talking to someone else   Today's notes: Jacob Wolf is seen and examined in [address].  The chart findings discussed with the treatment team and shared with the attending psychiatrist. He presents alert with an improved affect, good eye contact & verbally responsive.  Mood appears euthymic with congruent affect.  Denies symptoms of depression and anxiety and rated both as 0/10, with 10 being the worst.  Reports adequate sleep of over 8 hours last night.  MAR review indicates patient requested and was given hydroxyzine for anxiety yesterday at 1432.  Taking all scheduled medication and denies adverse effects to psychotropic medication.  Patient is visible on the unit and encouraged to attend and participate in therapeutic milieu and group activities.  Patient reports that he plans to be discharged to home with his grandparents first, and then will go to his wife after a few days.  Patient does not appear to be responding to internal or external stimuli.  No delusional thinking or paranoia observed.  Denies symptoms of depression or anxiety.  Labs and vital signs reviewed without critical values.  Principal Problem: Major  depressive disorder, recurrent episode, severe (HCC)  Diagnosis: Principal Problem:   Major depressive disorder, recurrent episode, severe (HCC) Active Problems:   Major depressive disorder, single episode, severe without psychosis (HCC)   Suicide attempt by hanging, initial encounter (HCC)  Total Time spent with patient:  35 minutes  Past Psychiatric History: See H&P  Past Medical History:  Past Medical History:  Diagnosis Date   Asthma    Asthma, mild intermittent 10/29/2011   Concussion     Past Surgical History:  Procedure Laterality Date   APPENDECTOMY     LAPAROSCOPIC APPENDECTOMY  04/16/2011   Procedure: APPENDECTOMY LAPAROSCOPIC;  Surgeon: Judie Petit. Leonia Corona, MD;  Location: [facility] OR;  Service: Pediatrics;  Laterality: N/A;   Lt toe removal     Family History: History reviewed. No pertinent family history.  Family Psychiatric  History: See H&P  Social History:  Social History   Substance and Sexual Activity  Alcohol Use No     Social History   Substance and Sexual Activity  Drug Use No    Social History   Socioeconomic History   Marital status: Married    Spouse name: Not on file   Number of children: Not on file   Years of education: Not on file   Highest education level: Not on file  Occupational History   Not on file  Tobacco Use   Smoking status: Never   Smokeless tobacco: Never  Vaping Use   Vaping Use: Never used  Substance and Sexual Activity   Alcohol use: No   Drug use: No   Sexual  activity: Yes  Other Topics Concern   Not on file  Social History Narrative   [employer]   10 th grade.         Social Determinants of Health   Financial Resource Strain: Not on file  Food Insecurity: Food Insecurity Present (03/22/2022)   Hunger Vital Sign    Worried About Running Out of Food in the Last Year: Patient refused    [place] in the Last Year: Sometimes true  Transportation Needs: No Transportation Needs (03/22/2022)   PRAPARE -  Hydrologist (Medical): No    Lack of Transportation (Non-Medical): No  Physical Activity: Not on file  Stress: Not on file  Social Connections: Not on file   Additional Social History:   Sleep: Good  Appetite:  Good  Current Medications: Current Facility-Administered Medications  Medication Dose Route Frequency Provider Last Rate Last Admin   acetaminophen (TYLENOL) tablet 650 mg  650 mg Oral Q6H PRN Onuoha, Chinwendu V, NP       alum & mag hydroxide-simeth (MAALOX/MYLANTA) 200-200-20 MG/5ML suspension 30 mL  30 mL Oral Q4H PRN Onuoha, Chinwendu V, NP       citalopram (CELEXA) tablet 20 mg  20 mg Oral Daily Onuoha, Chinwendu V, NP   20 mg at 03/26/22 8338   hydrOXYzine (ATARAX) tablet 25 mg  25 mg Oral TID PRN Onuoha, Chinwendu V, NP   25 mg at 03/25/22 1432   magnesium hydroxide (MILK OF MAGNESIA) suspension 30 mL  30 mL Oral Daily PRN Onuoha, Chinwendu V, NP       traZODone (DESYREL) tablet 50 mg  50 mg Oral QHS PRN Onuoha, Chinwendu V, NP       Lab Results:  No results found for this or any previous visit (from the past 48 hour(s)).  Blood Alcohol level:  Lab Results  Component Value Date   ETH <5 25/06/3974   Metabolic Disorder Labs: Lab Results  Component Value Date   HGBA1C 5.2 03/21/2022   MPG 102.54 03/21/2022   No results found for: "PROLACTIN" Lab Results  Component Value Date   CHOL 190 03/21/2022   TRIG 68 03/21/2022   HDL 48 03/21/2022   CHOLHDL 4.0 03/21/2022   VLDL 14 03/21/2022   LDLCALC 128 (H) 03/21/2022   Physical Findings: AIMS: Facial and Oral Movements Muscles of Facial Expression: None, normal Lips and Perioral Area: None, normal Jaw: None, normal Tongue: None, normal,Extremity Movements Upper (arms, wrists, hands, fingers): None, normal Lower (legs, knees, ankles, toes): None, normal, Trunk Movements Neck, shoulders, hips: None, normal, Overall Severity Severity of abnormal movements (highest score from  questions above): None, normal Incapacitation due to abnormal movements: None, normal Patient's awareness of abnormal movements (rate only patient's report): No Awareness, Dental Status Current problems with teeth and/or dentures?: No Does patient usually wear dentures?: No  CIWA:    COWS:     Musculoskeletal: Strength & Muscle Tone: within normal limits Gait & Station: normal Patient leans: N/A  Psychiatric Specialty Exam:  Presentation  General Appearance:  Appropriate for Environment; Casual; Fairly Groomed  Eye Contact: Good  Speech: Clear and Coherent; Normal Rate  Speech Volume: Normal  Handedness: Right  Mood and Affect  Mood: Euthymic  Affect: Congruent  Thought Process  Thought Processes: Coherent  Descriptions of Associations:Intact  Orientation:Full (Time, Place and Person)  Thought Content:Logical  History of Schizophrenia/Schizoaffective disorder:No  Duration of Psychotic Symptoms:No data recorded Hallucinations:Hallucinations: None  Ideas of Reference:None  Suicidal Thoughts:Suicidal Thoughts: No  Homicidal Thoughts:Homicidal Thoughts: No  Sensorium  Memory: Immediate Good; Recent Good  Judgment: Fair  Insight: Present  Executive Functions  Concentration: Good  Attention Span: Good  Recall: [place] of Knowledge: Fair  Language: Good  Psychomotor Activity  Psychomotor Activity:Psychomotor Activity: Normal  Assets  Assets: Communication Skills; Desire for Improvement; Housing; Physical Health  Sleep  Sleep:Sleep: Good Number of Hours of Sleep: 8  Physical Exam: Physical Exam Vitals and nursing note reviewed.  HENT:     Head: Normocephalic.     Nose: Nose normal.  Eyes:     Pupils: Pupils are equal, round, and reactive to light.  Cardiovascular:     Rate and Rhythm: Normal rate.     Pulses: Normal pulses.  Pulmonary:     Effort: Pulmonary effort is normal.  Genitourinary:    Comments:  Deferred Musculoskeletal:        General: Normal range of motion.     Cervical back: Normal range of motion.  Skin:    General: Skin is warm and dry.  Neurological:     General: No focal deficit present.     Mental Status: He is alert and oriented to person, place, and time.  Psychiatric:        Behavior: Behavior normal.    Review of Systems  Constitutional:  Negative for chills, diaphoresis and fever.  HENT:  Negative for congestion and sore throat.   Eyes:  Negative for blurred vision.  Respiratory:  Negative for cough, shortness of breath and wheezing.   Cardiovascular:  Negative for chest pain and palpitations.  Gastrointestinal:  Negative for abdominal pain, constipation, diarrhea, heartburn, nausea and vomiting.  Genitourinary:  Negative for dysuria.  Musculoskeletal:  Negative for joint pain and myalgias.  Skin:  Negative for itching and rash.  Neurological:  Negative for dizziness, tingling, tremors, sensory change, speech change, focal weakness, seizures, loss of consciousness, weakness and headaches.  Endo/Heme/Allergies:        Allergies: NKDA  Psychiatric/Behavioral:  Positive for depression. Negative for hallucinations, memory loss, substance abuse and suicidal ideas. The patient is not nervous/anxious and does not have insomnia.    Blood pressure 129/89, pulse 92, temperature 98.8 F (37.1 C), temperature source Oral, resp. rate 16, height 5\' 8"  (1.727 m), weight 78 kg, SpO2 97 %. Body mass index is 26.15 kg/m.  Treatment Plan Summary: Daily contact with patient to assess and evaluate symptoms and progress in treatment and Medication management.   Continue inpatient hospitalization.  Will continue today 03/26/2022 plan as below except where it is noted.    Principal/active diagnoses.  Major depressive disorder, recurrent episode, severe (HCC)  Plan:  -Continue Citalopram 20 mg po daily for depression. -Continue Hydroxyzine 25 mg po tid prn for anxiety.   -Continue Trazodone 50 mg po Q bedtime prn for insomnia.   Other PRNS -Continue Tylenol 650 mg every 6 hours PRN for mild pain -Continue Maalox 30 ml Q 4 hrs PRN for indigestion -Continue MOM 30 ml po Q 6 hrs for constipation   Safety and Monitoring: Voluntary admission to inpatient psychiatric unit for safety, stabilization and treatment Daily contact with patient to assess and evaluate symptoms and progress in treatment Patient's case to be discussed in multi-disciplinary team meeting Observation Level : q15 minute checks Vital signs: q12 hours Precautions: Safety   Discharge Planning: Social work and case management to assist with discharge planning and identification of hospital follow-up needs prior to discharge Estimated  LOS: 5-7 days Discharge Concerns: Need to establish a safety plan; Medication compliance and effectiveness Discharge Goals: Return home with outpatient referrals for mental health follow-up including medication management/psychotherapy  Laretta Bolster, FNP. 03/26/2022, 12:56 PMPatient ID: [place] C Mathieu, male   DOB: [birth year], 27 y.o.   MRN: [id number] Patient ID: [place] C Earp, male   DOB: December 14, 1995, 27 y.o.   MRN: [id number] Patient ID: [place] C Keizer, male   DOB: 1996/02/08, 27 y.o.   MRN: [id number]

## 2022-03-27 ENCOUNTER — Encounter (HOSPITAL_COMMUNITY): Payer: Self-pay

## 2022-03-27 DIAGNOSIS — F332 Major depressive disorder, recurrent severe without psychotic features: Secondary | ICD-10-CM | POA: Diagnosis not present

## 2022-03-27 NOTE — BHH Group Notes (Signed)
Adult Psychoeducational Group Note  Date:  03/27/2022 Time:  7:36 PM  Group Topic/Focus:  BING: Helps to improve memory and other cognitive skills, as well as strengthen focus and concentration.   Participation Level:  Did Not Attend  Jacob Wolf 03/27/2022, 7:36 PM

## 2022-03-27 NOTE — Progress Notes (Signed)
Pt in day room at times, social with peers. Pt did not attend all groups. Pt medication compliant. Pt denies suicidal and homicidal thoughts. Pt denies a/v hallucinations. Pt feels he is ready to go home. Q 15 minute checks ongoing.

## 2022-03-27 NOTE — BH IP Treatment Plan (Signed)
Interdisciplinary Treatment and Diagnostic Plan Update  03/27/2022 Time of Session: 10:00am  Jacob Wolf MRN: [id number]  Principal Diagnosis: Major depressive disorder, recurrent episode, severe ([place])  Secondary Diagnoses: Principal Problem:   Major depressive disorder, recurrent episode, severe ([place]) Active Problems:   Major depressive disorder, single episode, severe without psychosis ([place])   Suicide attempt by hanging, initial encounter ([place])   Current Medications:  Current Facility-Administered Medications  Medication Dose Route Frequency Provider Last Rate Last Admin   acetaminophen (TYLENOL) tablet 650 mg  650 mg Oral Q6H PRN Onuoha, Chinwendu V, NP       alum & mag hydroxide-simeth (MAALOX/MYLANTA) 200-200-20 MG/5ML suspension 30 mL  30 mL Oral Q4H PRN Onuoha, Chinwendu V, NP       citalopram (CELEXA) tablet 20 mg  20 mg Oral Daily Onuoha, Chinwendu V, NP   20 mg at 03/27/22 0756   hydrOXYzine (ATARAX) tablet 25 mg  25 mg Oral TID PRN Onuoha, Chinwendu V, NP   25 mg at 03/26/22 1305   magnesium hydroxide (MILK OF MAGNESIA) suspension 30 mL  30 mL Oral Daily PRN Onuoha, Chinwendu V, NP       traZODone (DESYREL) tablet 50 mg  50 mg Oral QHS PRN Onuoha, Chinwendu V, NP       PTA Medications: No medications prior to admission.    Patient Stressors: Financial difficulties   Marital or family conflict    Patient Strengths: Motivation for treatment/growth  Religious Affiliation  Supportive family/friends  Work skills   Treatment Modalities: Medication Management, Group therapy, Case management,  1 to 1 session with clinician, Psychoeducation, Recreational therapy.   Physician Treatment Plan for Primary Diagnosis: Major depressive disorder, recurrent episode, severe ([place]) Long Term Goal(s): Improvement in symptoms so as ready for discharge   Short Term Goals: Ability to identify and develop effective coping behaviors will improve Ability to maintain clinical  measurements within normal limits will improve Compliance with prescribed medications will improve Ability to identify triggers associated with substance abuse/mental health issues will improve Ability to identify changes in lifestyle to reduce recurrence of condition will improve Ability to verbalize feelings will improve Ability to disclose and discuss suicidal ideas Ability to demonstrate self-control will improve  Medication Management: Evaluate patient's response, side effects, and tolerance of medication regimen.  Therapeutic Interventions: 1 to 1 sessions, Unit Group sessions and Medication administration.  Evaluation of Outcomes: Progressing  Physician Treatment Plan for Secondary Diagnosis: Principal Problem:   Major depressive disorder, recurrent episode, severe ([place]) Active Problems:   Major depressive disorder, single episode, severe without psychosis ([place])   Suicide attempt by hanging, initial encounter ([place])  Long Term Goal(s): Improvement in symptoms so as ready for discharge   Short Term Goals: Ability to identify and develop effective coping behaviors will improve Ability to maintain clinical measurements within normal limits will improve Compliance with prescribed medications will improve Ability to identify triggers associated with substance abuse/mental health issues will improve Ability to identify changes in lifestyle to reduce recurrence of condition will improve Ability to verbalize feelings will improve Ability to disclose and discuss suicidal ideas Ability to demonstrate self-control will improve     Medication Management: Evaluate patient's response, side effects, and tolerance of medication regimen.  Therapeutic Interventions: 1 to 1 sessions, Unit Group sessions and Medication administration.  Evaluation of Outcomes: Progressing   RN Treatment Plan for Primary Diagnosis: Major depressive disorder, recurrent episode, severe ([place]) Long Term Goal(s):  Knowledge of disease and therapeutic regimen to  maintain health will improve  Short Term Goals: Ability to remain free from injury will improve, Ability to participate in decision making will improve, Ability to verbalize feelings will improve, Ability to disclose and discuss suicidal ideas, and Ability to identify and develop effective coping behaviors will improve  Medication Management: RN will administer medications as ordered by provider, will assess and evaluate patient's response and provide education to patient for prescribed medication. RN will report any adverse and/or side effects to prescribing provider.  Therapeutic Interventions: 1 on 1 counseling sessions, Psychoeducation, Medication administration, Evaluate responses to treatment, Monitor vital signs and CBGs as ordered, Perform/monitor CIWA, COWS, AIMS and Fall Risk screenings as ordered, Perform wound care treatments as ordered.  Evaluation of Outcomes: Progressing   LCSW Treatment Plan for Primary Diagnosis: Major depressive disorder, recurrent episode, severe ([place]) Long Term Goal(s): Safe transition to appropriate next level of care at discharge, Engage patient in therapeutic group addressing interpersonal concerns.  Short Term Goals: Engage patient in aftercare planning with referrals and resources, Increase social support, Increase emotional regulation, Facilitate acceptance of mental health diagnosis and concerns, Identify triggers associated with mental health/substance abuse issues, and Increase skills for wellness and recovery  Therapeutic Interventions: Assess for all discharge needs, 1 to 1 time with Social worker, Explore available resources and support systems, Assess for adequacy in community support network, Educate family and significant other(s) on suicide prevention, Complete Psychosocial Assessment, Interpersonal group therapy.  Evaluation of Outcomes: Progressing   Progress in Treatment: Attending groups:  Yes. Participating in groups: Yes. Taking medication as prescribed: Yes. Toleration medication: Yes. Family/Significant other contact made: Yes, will contact:  Gwenlyn Perking and Deloris Harriston [phone] (Grandparents)  Patient understands diagnosis: Yes. Discussing patient identified problems/goals with staff: Yes. Medical problems stabilized or resolved: Yes. Denies suicidal/homicidal ideation: Yes. Issues/concerns per patient self-inventory: No.     New problem(s) identified: No, Describe:  None reported    New Short Term/Long Term Goal(s): medication stabilization, elimination of SI thoughts, development of comprehensive mental wellness plan.     Patient Goals:  "Get back home"    Discharge Plan or Barriers: Patient recently admitted. CSW will continue to follow and assess for appropriate referrals and possible discharge planning.     Reason for Continuation of Hospitalization: Anxiety Depression Medication stabilization Suicidal ideation     Estimated Length of Stay: 3-5 days  Last 3 [place] Suicide Severity Risk Score: [place] Admission (Current) from 03/22/2022 in [place] [facility] ED from 03/21/2022 in [facility] ED from 02/04/2022 in [facility] Emergency Department at [place] High Risk High Risk No Risk       Last [facility] 2/9 Scores:     No data to display          Scribe for Treatment Team: Darleen Crocker, Latanya Presser 03/27/2022 11:04 AM

## 2022-03-27 NOTE — Progress Notes (Signed)
Adult Psychoeducational Group Note  Date:  03/27/2022 Time:  10:23 PM  Group Topic/Focus:  Wrap-Up Group:   The focus of this group is to help patients review their daily goal of treatment and discuss progress on daily workbooks.  Participation Level:  Active  Participation Quality:  Appropriate and Sharing  Affect:  Appropriate  Cognitive:  Appropriate  Insight: Appropriate  Engagement in Group:  Engaged  Modes of Intervention:  Discussion, Rapport Building, and Support  Additional Comments:   Pt attended and engaged in the [place] group. Pt mood 7/10 despite not getting his fruit snacks from the vending machine. Pt reports that he is discharging from treatment tomorrow 03/29/22. 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   Jacob Wolf 03/27/2022, 10:23 PM

## 2022-03-27 NOTE — Progress Notes (Signed)
[facility] MD Progress Note  03/27/2022 3:58 PM Jacob Wolf C Sawatzky  MRN:  [id number]  Reason for admission: 27 year old AA male with previous hx of mental illness. Jacob Wolf was a patient in this [facility] in October of 2016 with complaint of worsening symptoms of depression triggering suicidal ideations with some ideas to hang himself. He described his feelings then as wanting to die. The trigger at the time was relationship break-up. He was treated, stabilized & discharged on Citalopram. Jacob Wolf is being admitted to the [facility] this time with complain of worsening depression/suicidal ideation with an attempt to hang himself with a belt. This again was triggered by relationship problems as well. Chart review indicated that patient & wife were talking divorce, he saw on his wife's phone that she has already started talking to someone else   Today's notes: Jacob Wolf is seen in his room this morning. Chart reviewed. The chart findings discussed with the treatment team. He was lying down in bed. He presents alert, oriented & aware of situation. He is visible on the unit, attending group sessions. He is Engineer, maintenance (IT). His affect is bright & reactive. He is verbally responsive. He is making a good eye contact. He reports, "I'm doing very well. I'm cool. My mood is good. Still taking the medicine. Doing well on it. No side effects to report. I would like to know when I will be discharged because I feel it is time. I planned on going to my grandmothers house to chill for sometime". Jacob Wolf currently denies any SIHI, AVH, delusional thoughts or paranoia. He does not appear to be responding to any internal stimuli. If patient continues to be stable by tomorrow morning, he will be discharged. There are no changes made on the current plan of care, will continue as already in progress. Vital signs remain stable.  Principal Problem: Major depressive disorder, recurrent episode, severe (HCC)  Diagnosis: Principal Problem:   Major  depressive disorder, recurrent episode, severe (HCC) Active Problems:   Major depressive disorder, single episode, severe without psychosis (HCC)   Suicide attempt by hanging, initial encounter (HCC)  Total Time spent with patient:  35 minutes  Past Psychiatric History: See H&P  Past Medical History:  Past Medical History:  Diagnosis Date   Asthma    Asthma, mild intermittent 10/29/2011   Concussion     Past Surgical History:  Procedure Laterality Date   APPENDECTOMY     LAPAROSCOPIC APPENDECTOMY  04/16/2011   Procedure: APPENDECTOMY LAPAROSCOPIC;  Surgeon: Judie Petit. Leonia Corona, MD;  Location: [facility] OR;  Service: Pediatrics;  Laterality: N/A;   Lt toe removal     Family History: History reviewed. No pertinent family history.  Family Psychiatric  History: See H&P  Social History:  Social History   Substance and Sexual Activity  Alcohol Use No     Social History   Substance and Sexual Activity  Drug Use No    Social History   Socioeconomic History   Marital status: Married    Spouse name: Not on file   Number of children: Not on file   Years of education: Not on file   Highest education level: Not on file  Occupational History   Not on file  Tobacco Use   Smoking status: Never   Smokeless tobacco: Never  Vaping Use   Vaping Use: Never used  Substance and Sexual Activity   Alcohol use: No   Drug use: No   Sexual activity: Yes  Other Topics Concern  Not on file  Social History Narrative   [employer]   10 th grade.         Social Determinants of Health   Financial Resource Strain: Not on file  Food Insecurity: Food Insecurity Present (03/22/2022)   Hunger Vital Sign    Worried About Running Out of Food in the Last Year: Patient refused    [place] in the Last Year: Sometimes true  Transportation Needs: No Transportation Needs (03/22/2022)   PRAPARE - Hydrologist (Medical): No    Lack of Transportation (Non-Medical): No   Physical Activity: Not on file  Stress: Not on file  Social Connections: Not on file   Additional Social History:   Sleep: Good  Appetite:  Good  Current Medications: Current Facility-Administered Medications  Medication Dose Route Frequency Provider Last Rate Last Admin   acetaminophen (TYLENOL) tablet 650 mg  650 mg Oral Q6H PRN Onuoha, Chinwendu V, NP       alum & mag hydroxide-simeth (MAALOX/MYLANTA) 200-200-20 MG/5ML suspension 30 mL  30 mL Oral Q4H PRN Onuoha, Chinwendu V, NP       citalopram (CELEXA) tablet 20 mg  20 mg Oral Daily Onuoha, Chinwendu V, NP   20 mg at 03/27/22 0756   hydrOXYzine (ATARAX) tablet 25 mg  25 mg Oral TID PRN Onuoha, Chinwendu V, NP   25 mg at 03/26/22 1305   magnesium hydroxide (MILK OF MAGNESIA) suspension 30 mL  30 mL Oral Daily PRN Onuoha, Chinwendu V, NP       traZODone (DESYREL) tablet 50 mg  50 mg Oral QHS PRN Onuoha, Chinwendu V, NP       Lab Results:  No results found for this or any previous visit (from the past 48 hour(s)).  Blood Alcohol level:  Lab Results  Component Value Date   ETH <5 71/69/6789   Metabolic Disorder Labs: Lab Results  Component Value Date   HGBA1C 5.2 03/21/2022   MPG 102.54 03/21/2022   No results found for: "PROLACTIN" Lab Results  Component Value Date   CHOL 190 03/21/2022   TRIG 68 03/21/2022   HDL 48 03/21/2022   CHOLHDL 4.0 03/21/2022   VLDL 14 03/21/2022   LDLCALC 128 (H) 03/21/2022   Physical Findings: AIMS: Facial and Oral Movements Muscles of Facial Expression: None, normal Lips and Perioral Area: None, normal Jaw: None, normal Tongue: None, normal,Extremity Movements Upper (arms, wrists, hands, fingers): None, normal Lower (legs, knees, ankles, toes): None, normal, Trunk Movements Neck, shoulders, hips: None, normal, Overall Severity Severity of abnormal movements (highest score from questions above): None, normal Incapacitation due to abnormal movements: None, normal Patient's  awareness of abnormal movements (rate only patient's report): No Awareness, Dental Status Current problems with teeth and/or dentures?: No Does patient usually wear dentures?: No  CIWA:    COWS:     Musculoskeletal: Strength & Muscle Tone: within normal limits Gait & Station: normal Patient leans: N/A  Psychiatric Specialty Exam:  Presentation  General Appearance:  Appropriate for Environment; Casual; Fairly Groomed  Eye Contact: Good  Speech: Clear and Coherent; Normal Rate  Speech Volume: Normal  Handedness: Right  Mood and Affect  Mood: Euthymic  Affect: Congruent  Thought Process  Thought Processes: Coherent  Descriptions of Associations:Intact  Orientation:Full (Time, Place and Person)  Thought Content:Logical  History of Schizophrenia/Schizoaffective disorder:No  Duration of Psychotic Symptoms:No data recorded Hallucinations:Hallucinations: None  Ideas of Reference:None  Suicidal Thoughts:Suicidal Thoughts: No  Homicidal Thoughts:Homicidal  Thoughts: No  Sensorium  Memory: Immediate Good; Recent Good  Judgment: Fair  Insight: Present  Executive Functions  Concentration: Good  Attention Span: Good  Recall: Fair  Fund of Knowledge: Fair  Language: Good  Psychomotor Activity  Psychomotor Activity:Psychomotor Activity: Normal  Assets  Assets: Communication Skills; Desire for Improvement; Housing; Physical Health  Sleep  Sleep:Sleep: Good Number of Hours of Sleep: 8  Physical Exam: Physical Exam Vitals and nursing note reviewed.  HENT:     Head: Normocephalic.     Nose: Nose normal.  Eyes:     Pupils: Pupils are equal, round, and reactive to light.  Cardiovascular:     Rate and Rhythm: Normal rate.     Pulses: Normal pulses.  Pulmonary:     Effort: Pulmonary effort is normal.  Genitourinary:    Comments: Deferred Musculoskeletal:        General: Normal range of motion.     Cervical back: Normal range of  motion.  Skin:    General: Skin is warm and dry.  Neurological:     General: No focal deficit present.     Mental Status: He is alert and oriented to person, place, and time.  Psychiatric:        Behavior: Behavior normal.    Review of Systems  Constitutional:  Negative for chills, diaphoresis and fever.  HENT:  Negative for congestion and sore throat.   Eyes:  Negative for blurred vision.  Respiratory:  Negative for cough, shortness of breath and wheezing.   Cardiovascular:  Negative for chest pain and palpitations.  Gastrointestinal:  Negative for abdominal pain, constipation, diarrhea, heartburn, nausea and vomiting.  Genitourinary:  Negative for dysuria.  Musculoskeletal:  Negative for joint pain and myalgias.  Skin:  Negative for itching and rash.  Neurological:  Negative for dizziness, tingling, tremors, sensory change, speech change, focal weakness, seizures, loss of consciousness, weakness and headaches.  Endo/Heme/Allergies:        Allergies: NKDA  Psychiatric/Behavioral:  Positive for depression. Negative for hallucinations, memory loss, substance abuse and suicidal ideas. The patient is not nervous/anxious and does not have insomnia.    Blood pressure 121/80, pulse 96, temperature 97.9 F (36.6 C), temperature source Oral, resp. rate 16, height 5\' 8"  (1.727 m), weight 78 kg, SpO2 94 %. Body mass index is 26.15 kg/m.  Treatment Plan Summary: Daily contact with patient to assess and evaluate symptoms and progress in treatment and Medication management.   Continue inpatient hospitalization.  Will continue today 03/27/2022 plan as below except where it is noted.    Principal/active diagnoses.  Major depressive disorder, recurrent episode, severe (HCC)  Plan:  -Continue Citalopram 20 mg po daily for depression. -Continue Hydroxyzine 25 mg po tid prn for anxiety.  -Continue Trazodone 50 mg po Q bedtime prn for insomnia.   Other PRNS -Continue Tylenol 650 mg every 6  hours PRN for mild pain -Continue Maalox 30 ml Q 4 hrs PRN for indigestion -Continue MOM 30 ml po Q 6 hrs for constipation   Safety and Monitoring: Voluntary admission to inpatient psychiatric unit for safety, stabilization and treatment Daily contact with patient to assess and evaluate symptoms and progress in treatment Patient's case to be discussed in multi-disciplinary team meeting Observation Level : q15 minute checks Vital signs: q12 hours Precautions: Safety   Discharge Planning: Social work and case management to assist with discharge planning and identification of hospital follow-up needs prior to discharge Estimated LOS: 5-7 days Discharge Concerns: Need to  establish a safety plan; Medication compliance and effectiveness Discharge Goals: Return home with outpatient referrals for mental health follow-up including medication management/psychotherapy  Lindell Spar, NP. 03/27/2022, 3:58 PMPatient ID: [place] C Haeberle, male   DOB: 1995-10-22, 27 y.o.   MRN: [id number] Patient ID: [place] C Scadden, male   DOB: 1996-01-08, 27 y.o.   MRN: [id number] Patient ID: [place] C Bunting, male   DOB: Dec 21, 1995, 27 y.o.   MRN: [id number] Patient ID: [place] C Truax, male   DOB: 10/09/1995, 27 y.o.   MRN: [id number]

## 2022-03-27 NOTE — Progress Notes (Signed)
03/27/22 2130  Psych Admission Type (Psych Patients Only)  Admission Status Voluntary  Psychosocial Assessment  Patient Complaints Anxiety  Eye Contact Fair  Facial Expression Animated  Affect Appropriate to circumstance  Speech Logical/coherent  Interaction Assertive  Motor Activity Other (Comment) (WDL)  Appearance/Hygiene Unremarkable  Behavior Characteristics Cooperative;Appropriate to situation  Mood Pleasant  Thought Process  Coherency WDL  Content WDL  Delusions None reported or observed  Perception WDL  Hallucination None reported or observed  Judgment Limited  Confusion None  Danger to Self  Current suicidal ideation? Denies  Self-Injurious Behavior No self-injurious ideation or behavior indicators observed or expressed   Agreement Not to Harm Self Yes  Description of Agreement verbal  Danger to Others  Danger to Others None reported or observed

## 2022-03-27 NOTE — Progress Notes (Signed)
   03/26/22 2200  Psych Admission Type (Psych Patients Only)  Admission Status Voluntary  Psychosocial Assessment  Patient Complaints None  Eye Contact Fair  Facial Expression Animated  Affect Appropriate to circumstance  Speech Logical/coherent  Interaction Isolative  Motor Activity Slow  Appearance/Hygiene Unremarkable  Behavior Characteristics Cooperative;Appropriate to situation  Mood Preoccupied  Thought Process  Coherency WDL  Content WDL  Delusions None reported or observed  Perception WDL  Hallucination None reported or observed  Judgment Poor  Confusion None  Danger to Self  Current suicidal ideation? Denies  Self-Injurious Behavior No self-injurious ideation or behavior indicators observed or expressed   Agreement Not to Harm Self Yes  Description of Agreement verbal  Danger to Others  Danger to Others None reported or observed

## 2022-03-27 NOTE — BHH Group Notes (Signed)
Adult Psychoeducational Group Note  Date:  03/27/2022 Time:  3:34 PM  Group Topic/Focus:  Goals Group:   The focus of this group is to help patients establish daily goals to achieve during treatment and discuss how the patient can incorporate goal setting into their daily lives to aide in recovery.  Participation Level:  Did Not Attend  Richardean Chimera 03/27/2022, 3:34 PM

## 2022-03-27 NOTE — Plan of Care (Signed)
  Problem: Coping: Goal: Coping ability will improve Outcome: Progressing   Problem: Medication: Goal: Compliance with prescribed medication regimen will improve Outcome: Progressing   Problem: Coping: Goal: Coping ability will improve Outcome: Progressing   Problem: Education: Goal: Mental status will improve Outcome: Progressing

## 2022-03-28 DIAGNOSIS — F332 Major depressive disorder, recurrent severe without psychotic features: Secondary | ICD-10-CM | POA: Diagnosis not present

## 2022-03-28 MED ORDER — CITALOPRAM HYDROBROMIDE 20 MG PO TABS: 20.0000 mg | ORAL_TABLET | Freq: Every day | ORAL | 0 refills | Status: DC

## 2022-03-28 NOTE — Progress Notes (Signed)
   03/28/22 0529  15 Minute Checks  Location Bedroom  Visual Appearance Calm  Behavior Sleeping  Sleep (Behavioral Health Patients Only)  Calculate sleep? (Click Yes once per 24 hr at 0600 safety check) Yes  Documented sleep last 24 hours 7.25

## 2022-03-28 NOTE — Progress Notes (Signed)
Patient discharged from [facility] on 03/28/22. Patient denies SI, plan, and intention. Suicide safety plan completed, reviewed with this RN, given to the patient, and a copy in the chart. Patient denies HI/AVH upon discharge. Patient rates his depression a 0/10 and his anxiety a 0/10. Patient is alert, oriented, and cooperative. RN provided patient with discharge paperwork and reviewed information with patient. Patient expressed that he understood all of the discharge instructions. Pt was satisfied with belongings returned to him from the locker and at bedside. Discharged patient to [facility] waiting room.

## 2022-03-28 NOTE — BHH Suicide Risk Assessment (Signed)
[facility] Discharge Suicide Risk Assessment   Principal Problem: Major depressive disorder, recurrent episode, severe ([place]) Discharge Diagnoses: Principal Problem:   Major depressive disorder, recurrent episode, severe ([place]) Active Problems:   Major depressive disorder, single episode, severe without psychosis ([place])   Suicide attempt by hanging, initial encounter ([place])   Total Time spent with patient: 20 minutes  Musculoskeletal: Strength & Muscle Tone: within normal limits Gait & Station: normal Patient leans: N/A  Psychiatric Specialty Exam  Presentation  General Appearance:  Appropriate for Environment; Casual; Fairly Groomed  Eye Contact: Good  Speech: Clear and Coherent; Normal Rate  Speech Volume: Normal  Handedness: Right   Mood and Affect  Mood: Euthymic  Duration of Depression Symptoms: Less than two weeks  Affect: Congruent   Thought Process  Thought Processes: Coherent  Descriptions of Associations:Intact  Orientation:Full (Time, Place and Person)  Thought Content:Logical  History of Schizophrenia/Schizoaffective disorder:No  Duration of Psychotic Symptoms:No data recorded Hallucinations:No data recorded Ideas of Reference:None  Suicidal Thoughts:No data recorded Homicidal Thoughts:No data recorded  Sensorium  Memory: Immediate Good; Recent Good  Judgment: Fair  Insight: Present   Executive Functions  Concentration: Good  Attention Span: Good  Recall: Fair  Fund of Knowledge: Fair  Language: Good   Psychomotor Activity  Psychomotor Activity:No data recorded  Assets  Assets: Communication Skills; Desire for Improvement; Housing; Physical Health   Sleep  Sleep:No data recorded  Physical Exam: Physical Exam ROS Blood pressure 119/80, pulse 97, temperature 98.1 F (36.7 C), temperature source Oral, resp. rate 16, height 5\' 8"  (1.727 m), weight 78 kg, SpO2 98 %. Body mass index is 26.15 kg/m.  Mental  Status Per Nursing Assessment::   On Admission:  Self-harm thoughts, Self-harm behaviors, Intention to act on suicide plan  Demographic Factors:  Male, Adolescent or [place] adult, and NA  Loss Factors: NA  Historical Factors: Prior suicide attempts and Impulsivity  Risk Reduction Factors:   Employed and Living with another person, especially a relative  Continued Clinical Symptoms:  Depression:   Impulsivity Personality Disorders:   Cluster B  Cognitive Features That Contribute To Risk:  None    Suicide Risk:  Mild:  Suicidal ideation of limited frequency, intensity, duration, and specificity.  There are no identifiable plans, no associated intent, mild dysphoria and related symptoms, good self-control (both objective and subjective assessment), few other risk factors, and identifiable protective factors, including available and accessible social support.   Follow-up Information     Donneta Romberg, MD. Go on 04/15/2022.   Specialty: Psychiatry Why: You have an appointment for medication management services on 04/15/22 at 2:30 pm.  This appointment will be held in person. Contact information: [place] [address] [phone]         [facility] Follow up on 04/01/2022.   Why: You have an appointment for therapy services on 04/01/22 at 11:00 am.  This appointment will be Virtual. Contact information: [address] [place], [place] [address] [phone]                 Plan Of Care/Follow-up recommendations:  Activity:  As tolerated  Ranae Palms, MD 03/28/2022, 11:49 AM

## 2022-03-28 NOTE — Discharge Summary (Signed)
Physician Discharge Summary Note  Patient:  Jacob Wolf is an 27 y.o., male MRN:  [id number] DOB:  Jun 29, 1995 Patient phone:  [phone] (home)  Patient address:   [place] [address],  Total Time spent with patient: 20 minutes  Date of Admission:  03/22/2022 Date of Discharge: 03/28/2022  Reason for Admission:  Reason for admission: 27 year old AA male with previous hx of mental illness. Jacob was a patient in this [facility] in October of 2016 with complaint of worsening symptoms of depression triggering suicidal ideations with some ideas to hang himself. He described his feelings then as wanting to die. The trigger at the time was relationship break-up. He was treated, stabilized & discharged on Citalopram. Jacob is being admitted to the [facility] this time with complain of worsening depression/suicidal ideation with an attempt to hang himself with a belt. This again was triggered by relationship problems as well. Chart review indicated that patient & wife were talking divorce, he saw on his wife's phone that she has already started talking to someone else   Principal Problem: Major depressive disorder, recurrent episode, severe ([place]) Discharge Diagnoses: Principal Problem:   Major depressive disorder, recurrent episode, severe ([place]) Active Problems:   Major depressive disorder, single episode, severe without psychosis ([place])   Suicide attempt by hanging, initial encounter [facility])   Past Psychiatric History: Please see H&P  Past Medical History:  Past Medical History:  Diagnosis Date   Asthma    Asthma, mild intermittent 10/29/2011   Concussion     Past Surgical History:  Procedure Laterality Date   APPENDECTOMY     LAPAROSCOPIC APPENDECTOMY  04/16/2011   Procedure: APPENDECTOMY LAPAROSCOPIC;  Surgeon: Jerilynn Mages. Gerald Stabs, MD;  Location: [facility];  Service: Pediatrics;  Laterality: N/A;   Lt toe removal     Family History: History reviewed. No pertinent family  history. Family Psychiatric  History: Please see H&P Social History:  Social History   Substance and Sexual Activity  Alcohol Use No     Social History   Substance and Sexual Activity  Drug Use No    Social History   Socioeconomic History   Marital status: Married    Spouse name: Not on file   Number of children: Not on file   Years of education: Not on file   Highest education level: Not on file  Occupational History   Not on file  Tobacco Use   Smoking status: Never   Smokeless tobacco: Never  Vaping Use   Vaping Use: Never used  Substance and Sexual Activity   Alcohol use: No   Drug use: No   Sexual activity: Yes  Other Topics Concern   Not on file  Social History Narrative   [employer]   10 th grade.         Social Determinants of Health   Financial Resource Strain: Not on file  Food Insecurity: Food Insecurity Present (03/22/2022)   Hunger Vital Sign    Worried About Running Out of Food in the Last Year: Patient refused    [place] in the Last Year: Sometimes true  Transportation Needs: No Transportation Needs (03/22/2022)   PRAPARE - Hydrologist (Medical): No    Lack of Transportation (Non-Medical): No  Physical Activity: Not on file  Stress: Not on file  Social Connections: Not on file    Hospital Course:  HOSPITAL COURSE:  During the patient's hospitalization, patient  had extensive initial psychiatric evaluation, and follow-up psychiatric evaluations every day.  Psychiatric diagnoses provided upon initial assessment: Major depression with suicidal ideations.  Suicide attempt by hanging.  Patient's psychiatric medications were adjusted on admission: Citalopram 20 mg a day, hydroxyzine 25 mg 3 times daily as needed and trazodone 50 mg at bedtime as needed.  During the hospitalization, other adjustments were made to the patient's psychiatric medication regimen: None  Patient's care was discussed during the  interdisciplinary team meeting every day during the hospitalization.  The patient denied having side effects to prescribed psychiatric medication.  Gradually, patient started adjusting to milieu. The patient was evaluated each day by a clinical provider to ascertain response to treatment. Improvement was noted by the patient's report of decreasing symptoms, improved sleep and appetite, affect, medication tolerance, behavior, and participation in unit programming.  Patient was asked each day to complete a self inventory noting mood, mental status, pain, new symptoms, anxiety and concerns.    Symptoms were reported as significantly decreased or resolved completely by discharge.   On day of discharge, the patient reports that their mood is stable. The patient denied having suicidal thoughts for more than 48 hours prior to discharge.  Patient denies having homicidal thoughts.  Patient denies having auditory hallucinations.  Patient denies any visual hallucinations or other symptoms of psychosis. The patient was motivated to continue taking medication with a goal of continued improvement in mental health.   The patient reports their target psychiatric symptoms of depression responded well to the psychiatric medications, and the patient reports overall benefit other psychiatric hospitalization. Supportive psychotherapy was provided to the patient. The patient also participated in regular group therapy while hospitalized. Coping skills, problem solving as well as relaxation therapies were also part of the unit programming.  Labs were reviewed with the patient, and abnormal results were discussed with the patient.  The patient is able to verbalize their individual safety plan to this provider.  # It is recommended to the patient to continue psychiatric medications as prescribed, after discharge from the hospital.    # It is recommended to the patient to follow up with your outpatient psychiatric provider  and PCP.  # It was discussed with the patient, the impact of alcohol, drugs, tobacco have been there overall psychiatric and medical wellbeing, and total abstinence from substance use was recommended the patient.ed.  # Prescriptions provided or sent directly to preferred pharmacy at discharge. Patient agreeable to plan. Given opportunity to ask questions. Appears to feel comfortable with discharge.    # In the event of worsening symptoms, the patient is instructed to call the crisis hotline, 911 and or go to the nearest ED for appropriate evaluation and treatment of symptoms. To follow-up with primary care provider for other medical issues, concerns and or health care needs  # Patient was discharged home to his wife with a plan to follow up as noted below.   Physical Findings: AIMS: Facial and Oral Movements Muscles of Facial Expression: None, normal Lips and Perioral Area: None, normal Jaw: None, normal Tongue: None, normal,Extremity Movements Upper (arms, wrists, hands, fingers): None, normal Lower (legs, knees, ankles, toes): None, normal, Trunk Movements Neck, shoulders, hips: None, normal, Overall Severity Severity of abnormal movements (highest score from questions above): None, normal Incapacitation due to abnormal movements: None, normal Patient's awareness of abnormal movements (rate only patient's report): No Awareness, Dental Status Current problems with teeth and/or dentures?: No Does patient usually wear dentures?: No  CIWA:  COWS:     Musculoskeletal: Strength & Muscle Tone: within normal limits Gait & Station: normal Patient leans: N/A   Psychiatric Specialty Exam:  Presentation  General Appearance:  Appropriate for Environment  Eye Contact: Fair  Speech: Clear and Coherent  Speech Volume: Normal  Handedness: Right   Mood and Affect  Mood: Euthymic  Affect: Full Range   Thought Process  Thought Processes: Coherent  Descriptions of  Associations:Intact  Orientation:Full (Time, Place and Person)  Thought Content:Logical  History of Schizophrenia/Schizoaffective disorder:No  Duration of Psychotic Symptoms:No data recorded Hallucinations:No data recorded Ideas of Reference:None  Suicidal Thoughts:Suicidal Thoughts: No  Homicidal Thoughts:Homicidal Thoughts: No   Sensorium  Memory: Immediate Fair; Recent Fair; Remote Fair  Judgment: Fair  Insight: Fair   Community education officer  Concentration: Fair  Attention Span: Fair  Recall: [employer] of Knowledge: Fair  Language: Fair   Psychomotor Activity  Psychomotor Activity: Psychomotor Activity: Normal   Assets  Assets: Communication Skills; Desire for Improvement; Social Support   Sleep  Sleep: Sleep: Fair    Physical Exam: Physical Exam ROS Blood pressure 119/80, pulse 97, temperature 98.1 F (36.7 C), temperature source Oral, resp. rate 16, height 5\' 8"  (1.727 m), weight 78 kg, SpO2 98 %. Body mass index is 26.15 kg/m.   Social History   Tobacco Use  Smoking Status Never  Smokeless Tobacco Never   Tobacco Cessation:  N/A, patient does not currently use tobacco products   Blood Alcohol level:  Lab Results  Component Value Date   ETH <5 26/94/8546    Metabolic Disorder Labs:  Lab Results  Component Value Date   HGBA1C 5.2 03/21/2022   MPG 102.54 03/21/2022   No results found for: "PROLACTIN" Lab Results  Component Value Date   CHOL 190 03/21/2022   TRIG 68 03/21/2022   HDL 48 03/21/2022   CHOLHDL 4.0 03/21/2022   VLDL 14 03/21/2022   LDLCALC 128 (H) 03/21/2022    See Psychiatric Specialty Exam and Suicide Risk Assessment completed by Attending Physician prior to discharge.  Discharge destination:  Home  Is patient on multiple antipsychotic therapies at discharge:  No   Has Patient had three or more failed trials of antipsychotic monotherapy by history:  No  Recommended Plan for Multiple Antipsychotic  Therapies: NA  Discharge Instructions     Diet - low sodium heart healthy   Complete by: As directed    Increase activity slowly   Complete by: As directed       Allergies as of 03/28/2022   No Known Allergies      Medication List     TAKE these medications      Indication  citalopram 20 MG tablet Commonly known as: CELEXA Take 1 tablet (20 mg total) by mouth daily. Start taking on: March 29, 2022  Indication: Major Depressive Disorder        Follow-up Information     Donneta Romberg, MD. Go on 04/15/2022.   Specialty: Psychiatry Why: You have an appointment for medication management services on 04/15/22 at 2:30 pm.  This appointment will be held in person. Contact information: [place] [address] [phone]         [facility] Follow up on 04/01/2022.   Why: You have an appointment for therapy services on 04/01/22 at 11:00 am.  This appointment will be Virtual. Contact information: [place], [address], [place] [address] [phone]  Follow-up recommendations:  Activity:  As tolerated  Comments: The patient continued to endorse no active suicidal or homicidal ideations prior to discharge.  He was noted to be pleasant and cooperative and apparently had reconciled with his wife prior to discharge and his safety plan completed.  He will be returning back to the same living situation.  He will first go to his grandparents.  He is unclear whether he will be staying with his wife/fianc or not.  However the grandparents are in agreement with his discharge.  Patient has appropriate follow-up appointment scheduled.  Signed: Ranae Palms, MD 03/28/2022, 2:50 PM  Total Time Spent in Direct Patient Care:  I personally spent 25 minutes on the unit in direct patient care. The direct patient care time included face-to-face time with the patient, reviewing the  patient's chart, communicating with other professionals, and coordinating care. Greater than 50% of this time was spent in counseling or coordinating care with the patient regarding goals of hospitalization, psycho-education, and discharge planning needs.   [place] Psychiatrist

## 2022-03-28 NOTE — Progress Notes (Signed)
   03/28/22 0953  Psych Admission Type (Psych Patients Only)  Admission Status Voluntary  Psychosocial Assessment  Patient Complaints Anxiety  Eye Contact Fair  Facial Expression Animated  Affect Appropriate to circumstance  Speech Logical/coherent  Interaction Assertive  Motor Activity Other (Comment) (WDL)  Appearance/Hygiene Unremarkable  Behavior Characteristics Cooperative;Appropriate to situation  Mood Pleasant  Thought Process  Coherency WDL  Content WDL  Delusions None reported or observed  Perception WDL  Hallucination None reported or observed  Judgment Limited  Confusion None  Danger to Self  Current suicidal ideation? Denies  Self-Injurious Behavior No self-injurious ideation or behavior indicators observed or expressed   Agreement Not to Harm Self Yes  Description of Agreement verbal  Danger to Others  Danger to Others None reported or observed  Danger to Others Abnormal  Harmful Behavior to others No threats or harm toward other people

## 2022-03-28 NOTE — Group Note (Signed)
Date:  03/28/2022 Time:  10:10 AM  Group Topic/Focus:  Orientation:   The focus of this group is to educate the patient on the purpose and policies of crisis stabilization and provide a format to answer questions about their admission.  The group details unit policies and expectations of patients while admitted.    Participation Level:  Active  Participation Quality:  Appropriate  Affect:  Appropriate  Cognitive:  Appropriate  Insight: Appropriate  Engagement in Group:  Engaged  Modes of Intervention:  Discussion  Additional Comments:     Jerrye Beavers 03/28/2022, 10:10 AM

## 2022-03-28 NOTE — Progress Notes (Signed)
  [facility] Adult Case Management Discharge Plan :  Will you be returning to the same living situation after discharge:  No. Home with grandparents  At discharge, do you have transportation home?: Yes,  Grandparents  Do you have the ability to pay for your medications: Yes,  [employer]  Release of information consent forms completed and in the chart;  Patient's signature needed at discharge.  Patient to Follow up at:  Follow-up Information     Donneta Romberg, MD. Go on 04/15/2022.   Specialty: Psychiatry Why: You have an appointment for medication management services on 04/15/22 at 2:30 pm.  This appointment will be held in person. Contact information: [place] [address] [phone]         [facility] Follow up on 04/01/2022.   Why: You have an appointment for therapy services on 04/01/22 at 11:00 am.  This appointment will be Virtual. Contact information: [place], [address], [place] [address] [phone]                 Next level of care provider has access to [place] and Suicide Prevention discussed: Yes,  with patient and grandparents      Has patient been referred to the Quitline?: N/A patient is not a smoker  Patient has been referred for addiction treatment: [place] [place] 03/28/2022, 9:09 AM

## 2022-12-01 ENCOUNTER — Ambulatory Visit (HOSPITAL_COMMUNITY)
Admission: EM | Admit: 2022-12-01 | Discharge: 2022-12-01 | Disposition: A | Discharge status: Home / Self Care | Payer: BLUE CROSS/BLUE SHIELD | Attending: Internal Medicine | Admitting: Internal Medicine

## 2022-12-01 ENCOUNTER — Encounter (HOSPITAL_COMMUNITY): Payer: Self-pay | Admitting: Emergency Medicine

## 2022-12-01 DIAGNOSIS — B349 Viral infection, unspecified: Secondary | ICD-10-CM | POA: Insufficient documentation

## 2022-12-01 DIAGNOSIS — R509 Fever, unspecified: Secondary | ICD-10-CM | POA: Diagnosis not present

## 2022-12-01 DIAGNOSIS — Z1152 Encounter for screening for COVID-19: Secondary | ICD-10-CM | POA: Diagnosis not present

## 2022-12-01 DIAGNOSIS — R531 Weakness: Secondary | ICD-10-CM | POA: Diagnosis not present

## 2022-12-01 DIAGNOSIS — R197 Diarrhea, unspecified: Secondary | ICD-10-CM | POA: Insufficient documentation

## 2022-12-01 LAB — CBC WITH DIFFERENTIAL/PLATELET
Abs Immature Granulocytes: 0.05 10*3/uL (ref 0.00–0.07)
Basophils Absolute: 0 10*3/uL (ref 0.0–0.1)
Basophils Relative: 0 %
Eosinophils Absolute: 0 10*3/uL (ref 0.0–0.5)
Eosinophils Relative: 0 %
HCT: 43.9 % (ref 39.0–52.0)
Hemoglobin: 15.2 g/dL (ref 13.0–17.0)
Immature Granulocytes: 1 %
Lymphocytes Relative: 20 %
Lymphs Abs: 2 10*3/uL (ref 0.7–4.0)
MCH: 29.8 pg (ref 26.0–34.0)
MCHC: 34.6 g/dL (ref 30.0–36.0)
MCV: 86.1 fL (ref 80.0–100.0)
Monocytes Absolute: 1.4 10*3/uL — ABNORMAL HIGH (ref 0.1–1.0)
Monocytes Relative: 14 %
Neutro Abs: 6.8 10*3/uL (ref 1.7–7.7)
Neutrophils Relative %: 65 %
Platelets: 175 10*3/uL (ref 150–400)
RBC: 5.1 MIL/uL (ref 4.22–5.81)
RDW: 11.9 % (ref 11.5–15.5)
WBC: 10.4 10*3/uL (ref 4.0–10.5)
nRBC: 0 % (ref 0.0–0.2)

## 2022-12-01 LAB — POCT URINALYSIS DIP (MANUAL ENTRY)
Bilirubin, UA: NEGATIVE
Blood, UA: NEGATIVE
Glucose, UA: NEGATIVE mg/dL
Ketones, POC UA: NEGATIVE mg/dL
Leukocytes, UA: NEGATIVE
Nitrite, UA: NEGATIVE
Protein Ur, POC: NEGATIVE mg/dL
Spec Grav, UA: 1.015 (ref 1.010–1.025)
Urobilinogen, UA: 4 U/dL — AB
pH, UA: 6.5 (ref 5.0–8.0)

## 2022-12-01 LAB — COMPREHENSIVE METABOLIC PANEL
ALT: 25 U/L (ref 0–44)
AST: 26 U/L (ref 15–41)
Albumin: 3.8 g/dL (ref 3.5–5.0)
Alkaline Phosphatase: 46 U/L (ref 38–126)
Anion gap: 17 — ABNORMAL HIGH (ref 5–15)
BUN: 11 mg/dL (ref 6–20)
CO2: 26 mmol/L (ref 22–32)
Calcium: 9.1 mg/dL (ref 8.9–10.3)
Chloride: 91 mmol/L — ABNORMAL LOW (ref 98–111)
Creatinine, Ser: 1.19 mg/dL (ref 0.61–1.24)
GFR, Estimated: >60 mL/min (ref >60)
Glucose, Bld: 117 mg/dL — ABNORMAL HIGH (ref 70–99)
Potassium: 3.5 mmol/L (ref 3.5–5.1)
Sodium: 134 mmol/L — ABNORMAL LOW (ref 135–145)
Total Bilirubin: 1.1 mg/dL (ref 0.3–1.2)
Total Protein: 7.7 g/dL (ref 6.5–8.1)

## 2022-12-01 MED ORDER — ACETAMINOPHEN 325 MG PO TABS: 650.0000 mg | ORAL_TABLET | Freq: Once | ORAL | Status: DC

## 2022-12-01 MED ORDER — ACETAMINOPHEN 325 MG PO TABS
ORAL_TABLET | ORAL | Status: AC
  Filled 2022-12-01: qty 3

## 2022-12-01 MED ORDER — IBUPROFEN 800 MG PO TABS
ORAL_TABLET | ORAL | Status: AC
  Filled 2022-12-01: qty 1

## 2022-12-01 MED ORDER — IBUPROFEN 800 MG PO TABS
800.0000 mg | ORAL_TABLET | Freq: Once | ORAL | Status: AC
  Administered 2022-12-01: 800 mg via ORAL

## 2022-12-01 MED ORDER — IBUPROFEN 800 MG PO TABS: 800.0000 mg | ORAL_TABLET | Freq: Three times a day (TID) | ORAL | 0 refills | Status: DC

## 2022-12-01 MED ORDER — ACETAMINOPHEN 325 MG PO TABS
975.0000 mg | ORAL_TABLET | Freq: Once | ORAL | Status: AC
  Administered 2022-12-01: 975 mg via ORAL

## 2022-12-01 NOTE — ED Triage Notes (Signed)
Pt c/o feeling very weak especially the last 3 days. States he works all the time. He did have an episode of diarrhea, and some headaches.

## 2022-12-01 NOTE — ED Provider Notes (Signed)
[facility]    CSN: [id number] Arrival date & time: 12/01/22  1430      History   Chief Complaint Chief Complaint  Patient presents with   Nausea   Weakness    HPI [place] C Vanhoesen is a 27 y.o. male.   Patient presents today with a 3-day history of URI symptoms.  He reports fever, generalized weakness, cough, sore throat, congestion, diarrhea.  He describes diarrhea as 3-5 watery bowel movements per day.  His last bowel movement was within the past hour.  Denies any associated blood or mucus.  He denies any nausea, vomiting, abdominal pain.  He has not tried any over-the-counter medication for symptom management.  Denies any known sick contacts.  Denies any suspicious food intake, recent travel, medication changes, antibiotic use.  He has had an appendectomy but denies any additional abdominal surgery.  He does report decreased appetite but is drinking normally.  Denies any urinary symptoms.  He has not had COVID in the past.    Past Medical History:  Diagnosis Date   Asthma    Asthma, mild intermittent 10/29/2011   Concussion     Patient Active Problem List   Diagnosis Date Noted   Suicide attempt by hanging, initial encounter ([facility]) 03/22/2022   Major depressive disorder, recurrent episode, severe ([facility]) 03/22/2022   Severe major depression, single episode ([facility]) 12/05/2014   Major depressive disorder, single episode, severe without psychosis ([facility]) 12/05/2014   Asthma, mild intermittent 10/29/2011    Past Surgical History:  Procedure Laterality Date   APPENDECTOMY     LAPAROSCOPIC APPENDECTOMY  04/16/2011   Procedure: APPENDECTOMY LAPAROSCOPIC;  Surgeon: Judie Petit. Leonia Corona, MD;  [facility];  Service: Pediatrics;  Laterality: N/A;   Lt toe removal         Home Medications    Prior to Admission medications   Medication Sig Start Date End Date Taking? Authorizing Provider  ibuprofen (ADVIL) 800 MG tablet Take 1 tablet (800 mg total) by mouth 3 (three)  times daily. 12/01/22  Yes Michaelanthony Kempton K, PA-C  citalopram (CELEXA) 20 MG tablet Take 1 tablet (20 mg total) by mouth daily. Patient not taking: Reported on 12/01/2022 03/29/22   Rex Kras, MD    Family History History reviewed. No pertinent family history.  Social History Social History   Tobacco Use   Smoking status: Never   Smokeless tobacco: Never  Vaping Use   Vaping status: Never Used  Substance Use Topics   Alcohol use: No   Drug use: No     Allergies   Patient has no known allergies.   Review of Systems Review of Systems  Constitutional:  Positive for activity change, chills and fever. Negative for appetite change and fatigue.  HENT:  Positive for congestion and sore throat. Negative for sinus pressure and sneezing.   Respiratory:  Positive for cough. Negative for shortness of breath.   Cardiovascular:  Negative for chest pain.  Gastrointestinal:  Positive for diarrhea. Negative for abdominal pain, nausea and vomiting.  Musculoskeletal:  Negative for arthralgias and myalgias.  Neurological:  Positive for weakness (generalized). Negative for dizziness, light-headedness and headaches.     Physical Exam Triage Vital Signs ED Triage Vitals  Encounter Vitals Group     BP 12/01/22 1449 110/71     Systolic BP Percentile --      Diastolic BP Percentile --      Pulse Rate 12/01/22 1449 (!) 115     Resp 12/01/22 1449  17     Temp 12/01/22 1449 (!) 102 F (38.9 C)     Temp Source 12/01/22 1449 Oral     SpO2 12/01/22 1449 96 %     Weight --      Height --      Head Circumference --      Peak Flow --      Pain Score 12/01/22 1447 0     Pain Loc --      Pain Education --      Exclude from Growth Chart --    No data found.  Updated Vital Signs BP 110/71 (BP Location: Right Arm)   Pulse 90   Temp 99.1 F (37.3 C)   Resp 17   SpO2 96%   Visual Acuity Right Eye Distance:   Left Eye Distance:   Bilateral Distance:    Right Eye Near:   Left Eye Near:     Bilateral Near:     Physical Exam Vitals reviewed.  Constitutional:      General: He is awake.     Appearance: Normal appearance. He is well-developed. He is not ill-appearing.     Comments: Very pleasant male appears stated age in no acute distress sitting comfortably in exam room  HENT:     Head: Normocephalic and atraumatic.     Right Ear: Tympanic membrane, ear canal and external ear normal. Tympanic membrane is not erythematous or bulging.     Left Ear: Tympanic membrane, ear canal and external ear normal. Tympanic membrane is not erythematous or bulging.     Nose: Nose normal.     Mouth/Throat:     Pharynx: Uvula midline. No oropharyngeal exudate or posterior oropharyngeal erythema.  Cardiovascular:     Rate and Rhythm: Regular rhythm. Tachycardia present.     Heart sounds: Normal heart sounds, S1 normal and S2 normal. No murmur heard. Pulmonary:     Effort: Pulmonary effort is normal. No accessory muscle usage or respiratory distress.     Breath sounds: Normal breath sounds. No stridor. No wheezing, rhonchi or rales.     Comments: Clear to auscultation bilaterally Abdominal:     General: Bowel sounds are normal.     Palpations: Abdomen is soft.     Tenderness: There is no abdominal tenderness. There is no right CVA tenderness, left CVA tenderness, guarding or rebound.     Comments: Benign abdominal exam  Neurological:     Mental Status: He is alert.  Psychiatric:        Behavior: Behavior is cooperative.      UC Treatments / Results  Labs (all labs ordered are listed, but only abnormal results are displayed) Labs Reviewed  POCT URINALYSIS DIP (MANUAL ENTRY) - Abnormal; Notable for the following components:      Result Value   Urobilinogen, UA 4.0 (*)    All other components within normal limits  SARS CORONAVIRUS 2 (TAT 6-24 HRS)  CBC WITH DIFFERENTIAL/PLATELET  COMPREHENSIVE METABOLIC PANEL    EKG   Radiology No results found.  Procedures Procedures  (including critical care time)  Medications Ordered in UC Medications  acetaminophen (TYLENOL) tablet 975 mg (975 mg Oral Given 12/01/22 1504)  ibuprofen (ADVIL) tablet 800 mg (800 mg Oral Given 12/01/22 1548)    Initial Impression / Assessment and Plan / UC Course  I have reviewed the triage vital signs and the nursing notes.  Pertinent labs & imaging results that were available during my care of the patient were  reviewed by me and considered in my medical decision making (see chart for details).     Patient was initially febrile and tachycardic but this improved following antipyretics in clinic.  Discussed likely viral etiology of symptoms as he has no evidence of acute infection on physical exam.  COVID testing was obtained and is pending.  We will contact him if this is positive.  He is [place] and otherwise healthy is not a candidate for antiviral therapy.  UA was obtained that showed no evidence of infection or significant dehydration.  He was encouraged to push fluids including electrolyte solution such as Pedialyte.  He was given ibuprofen 800 mg to help with his pain and to manage the fever.  Discussed that he is not to take additional NSAIDs with this medication but can use acetaminophen/Tylenol for breakthrough pain.  We discussed that if his symptoms are not improving within a few days or if anything worsens and he has shortness of breath, chest pain, worsening cough, nausea/vomiting, recurrent diarrhea, melena, hematochezia he needs to be seen emergently.  Strict return precautions given.  Work excuse note provided.   Final Clinical Impressions(s) / UC Diagnoses   Final diagnoses:  Viral illness  Fever, unspecified  Diarrhea, unspecified type  Generalized weakness     Discharge Instructions      Your urine did not show any evidence of dehydration.  Continue taking ibuprofen 800 mg every 8 hours as needed for fever and other symptoms.  Make sure that you are drinking lots of  fluid including electrolyte solution such as Pedialyte or propel.  We will contact you if any of your blood work is abnormal or if you are positive for COVID.  Avoid any strenuous activity for the next several days.  I have provided you a work excuse note.  If anything worsens and you have shortness of breath, chest pain, nausea/vomiting, recurrent diarrhea, blood in your stool, blood in your vomit you need to be seen immediately.     ED Prescriptions     Medication Sig Dispense Auth. Provider   ibuprofen (ADVIL) 800 MG tablet Take 1 tablet (800 mg total) by mouth 3 (three) times daily. 21 tablet Bryony Kaman, Noberto Retort, PA-C      PDMP not reviewed this encounter.   Jeani Hawking, PA-C 12/01/22 1641

## 2022-12-01 NOTE — Discharge Instructions (Signed)
Your urine did not show any evidence of dehydration.  Continue taking ibuprofen 800 mg every 8 hours as needed for fever and other symptoms.  Make sure that you are drinking lots of fluid including electrolyte solution such as Pedialyte or propel.  We will contact you if any of your blood work is abnormal or if you are positive for COVID.  Avoid any strenuous activity for the next several days.  I have provided you a work excuse note.  If anything worsens and you have shortness of breath, chest pain, nausea/vomiting, recurrent diarrhea, blood in your stool, blood in your vomit you need to be seen immediately.

## 2022-12-02 LAB — SARS CORONAVIRUS 2 (TAT 6-24 HRS): SARS Coronavirus 2: NEGATIVE

## 2023-10-02 ENCOUNTER — Ambulatory Visit (HOSPITAL_COMMUNITY): Payer: Self-pay

## 2023-12-18 ENCOUNTER — Ambulatory Visit (HOSPITAL_COMMUNITY)
Admission: EM | Admit: 2023-12-18 | Discharge: 2023-12-18 | Disposition: A | Discharge status: Home / Self Care | Payer: Self-pay | Attending: Family Medicine | Admitting: Family Medicine

## 2023-12-18 ENCOUNTER — Encounter (HOSPITAL_COMMUNITY): Payer: Self-pay

## 2023-12-18 DIAGNOSIS — F330 Major depressive disorder, recurrent, mild: Secondary | ICD-10-CM

## 2023-12-18 MED ORDER — CITALOPRAM HYDROBROMIDE 20 MG PO TABS: 20.0000 mg | ORAL_TABLET | Freq: Every day | ORAL | 1 refills | Status: DC

## 2023-12-18 MED ORDER — HYDROXYZINE HCL 25 MG PO TABS: 25.0000 mg | ORAL_TABLET | Freq: Three times a day (TID) | ORAL | 1 refills | Status: DC | PRN

## 2023-12-18 NOTE — ED Triage Notes (Signed)
 Patient here today requesting a refill of Citalopram  and Hydroxyzine . Patient states that it has been a while since he has taken both of them but has been feeling more anxious lately.

## 2023-12-18 NOTE — Discharge Instructions (Signed)
-   Take citalopram  20 mg, 1 tablet daily. - Please take hydroxyzine  25 mg tablet every hours as needed for anxiety attacks, please note this medication can make you sleepy do not drive or operate heavy machinery when you take this medication - Please establish care with a PCP so that you can be reevaluated and prevent lapse in medication refills, you can look at the back of this paper at the QR code

## 2023-12-18 NOTE — ED Provider Notes (Addendum)
 [facility]    CSN: [id number] Arrival date & time: 12/18/23  1400      History   Chief Complaint Chief Complaint  Patient presents with   Medication Refill    HPI Jacob Wolf is a 28 y.o. male.   Presenting today for refill of citalopram  and hydroxyzine .  Has not had hydroxyzine  in approximately year, reports he has been taking citalopram  up until a few months ago.  Reports that when he was take his medications his depression was well-controlled.  He is concerned today because he is having worsening depressive symptoms including loss of interest, fatigue, sleep disturbance, poor concentration change in appetite.  Denies all SI/HI.  Unfortunately does have a prior history of suicide attempt 1 year ago.    Medication Refill   Past Medical History:  Diagnosis Date   Asthma    Asthma, mild intermittent 10/29/2011   Concussion     Patient Active Problem List   Diagnosis Date Noted   Suicide attempt by hanging, initial encounter (HCC) 03/22/2022   Major depressive disorder, recurrent episode, severe (HCC) 03/22/2022   Severe major depression, single episode (HCC) 12/05/2014   Major depressive disorder, single episode, severe without psychosis (HCC) 12/05/2014   Asthma, mild intermittent 10/29/2011    Past Surgical History:  Procedure Laterality Date   APPENDECTOMY     LAPAROSCOPIC APPENDECTOMY  04/16/2011   Procedure: APPENDECTOMY LAPAROSCOPIC;  Surgeon: CHRISTELLA. Julietta Millman, MD;  Location: [facility] OR;  Service: Pediatrics;  Laterality: N/A;   Lt toe removal         Home Medications    Prior to Admission medications   Medication Sig Start Date End Date Taking? Authorizing Provider  citalopram  (CELEXA ) 20 MG tablet Take 1 tablet (20 mg total) by mouth daily. 12/18/23   Howell Lunger, DO  hydrOXYzine  (ATARAX ) 25 MG tablet Take 1 tablet (25 mg total) by mouth every 8 (eight) hours as needed for anxiety. 12/18/23   Howell Lunger, DO    Family  History History reviewed. No pertinent family history.  Social History Social History   Tobacco Use   Smoking status: Never   Smokeless tobacco: Never  Vaping Use   Vaping status: Never Used  Substance Use Topics   Alcohol use: No   Drug use: No     Allergies   Patient has no known allergies.   Review of Systems Review of Systems   Physical Exam Triage Vital Signs ED Triage Vitals  Encounter Vitals Group     BP 12/18/23 1419 120/80     Girls Systolic BP Percentile --      Girls Diastolic BP Percentile --      Boys Systolic BP Percentile --      Boys Diastolic BP Percentile --      Pulse Rate 12/18/23 1419 100     Resp 12/18/23 1419 16     Temp 12/18/23 1419 98.6 F (37 C)     Temp Source 12/18/23 1419 Oral     SpO2 12/18/23 1419 94 %     Weight --      Height --      Head Circumference --      Peak Flow --      Pain Score 12/18/23 1416 0     Pain Loc --      Pain Education --      Exclude from Growth Chart --    No data found.  Updated Vital Signs BP 120/80 (BP  Location: Left Arm)   Pulse 100   Temp 98.6 F (37 C) (Oral)   Resp 16   SpO2 94%   Visual Acuity Right Eye Distance:   Left Eye Distance:   Bilateral Distance:    Right Eye Near:   Left Eye Near:    Bilateral Near:     Physical Exam Constitutional:      Appearance: Normal appearance.  Cardiovascular:     Rate and Rhythm: Normal rate and regular rhythm.     Heart sounds: Normal heart sounds.  Pulmonary:     Effort: Pulmonary effort is normal.     Breath sounds: Normal breath sounds.  Skin:    General: Skin is warm and dry.  Neurological:     Mental Status: He is alert.  Psychiatric:        Mood and Affect: Mood normal.        Thought Content: Thought content normal.      UC Treatments / Results  Labs (all labs ordered are listed, but only abnormal results are displayed) Labs Reviewed - No data to display  EKG   Radiology No results found.  Procedures Procedures  (including critical care time)  Medications Ordered in UC Medications - No data to display  Initial Impression / Assessment and Plan / UC Course  I have reviewed the triage vital signs and the nursing notes.  Pertinent labs & imaging results that were available during my care of the patient were reviewed by me and considered in my medical decision making (see chart for details).     Well-appearing 28 year old male with depression and history of suicide attempt little over a year ago presenting for refills of his citalopram  and hydroxyzine .  Vitals are stable, denies significant effects with citalopram /hydroxyzine .  No SI/HI today.  Plan for 31-month supply of medication, with PCP follow-up.  Resources for establishing care with PCP provided.  Follow-up and return precautions discussed.  Final Clinical Impressions(s) / UC Diagnoses   Final diagnoses:  Mild episode of recurrent major depressive disorder     Discharge Instructions      - Take citalopram  20 mg, 1 tablet daily. - Please take hydroxyzine  25 mg tablet every hours as needed for anxiety attacks, please note this medication can make you sleepy do not drive or operate heavy machinery when you take this medication - Please establish care with a PCP so that you can be reevaluated and prevent lapse in medication refills, you can look at the back of this paper at the QR code     ED Prescriptions     Medication Sig Dispense Auth. Provider   citalopram  (CELEXA ) 20 MG tablet Take 1 tablet (20 mg total) by mouth daily. 30 tablet Davinity Fanara, DO   hydrOXYzine  (ATARAX ) 25 MG tablet Take 1 tablet (25 mg total) by mouth every 8 (eight) hours as needed for anxiety. 30 tablet Savio Albrecht, DO      PDMP not reviewed this encounter.   Howell Lunger, DO 12/18/23 1537    Howell Lunger, [place] 12/18/23 1537

## 2024-01-01 ENCOUNTER — Other Ambulatory Visit: Payer: Self-pay

## 2024-01-01 ENCOUNTER — Encounter (HOSPITAL_COMMUNITY): Payer: Self-pay

## 2024-01-01 ENCOUNTER — Emergency Department (HOSPITAL_COMMUNITY)
Admission: EM | Admit: 2024-01-01 | Discharge: 2024-01-02 | Disposition: A | Discharge status: Other Acute Inpatient Hospital | Payer: Self-pay | Attending: Emergency Medicine | Admitting: Emergency Medicine

## 2024-01-01 DIAGNOSIS — R45851 Suicidal ideations: Secondary | ICD-10-CM | POA: Insufficient documentation

## 2024-01-01 DIAGNOSIS — J4520 Mild intermittent asthma, uncomplicated: Secondary | ICD-10-CM | POA: Insufficient documentation

## 2024-01-01 DIAGNOSIS — Z046 Encounter for general psychiatric examination, requested by authority: Secondary | ICD-10-CM

## 2024-01-01 DIAGNOSIS — F332 Major depressive disorder, recurrent severe without psychotic features: Secondary | ICD-10-CM | POA: Insufficient documentation

## 2024-01-01 NOTE — ED Triage Notes (Signed)
 Pt. Arrives via [facility] under [facility]. Pt. Has been committed in the past for attempting to commit suicide. Tonight pt. States that he was leaving the home to kill himself. Does have a plan but would not tell. States that if he did not do it tonight he would do it soon.

## 2024-01-02 ENCOUNTER — Inpatient Hospital Stay
Admission: AD | Admit: 2024-01-02 | Discharge: 2024-01-08 | DRG: 885 | Disposition: A | Discharge status: Home / Self Care | Payer: 59 | Source: Intra-hospital | Attending: Psychiatry | Admitting: Psychiatry

## 2024-01-02 ENCOUNTER — Encounter: Payer: Self-pay | Admitting: Family

## 2024-01-02 DIAGNOSIS — Z89422 Acquired absence of other left toe(s): Secondary | ICD-10-CM

## 2024-01-02 DIAGNOSIS — F419 Anxiety disorder, unspecified: Secondary | ICD-10-CM | POA: Diagnosis present

## 2024-01-02 DIAGNOSIS — R45851 Suicidal ideations: Secondary | ICD-10-CM

## 2024-01-02 DIAGNOSIS — J4520 Mild intermittent asthma, uncomplicated: Secondary | ICD-10-CM | POA: Diagnosis present

## 2024-01-02 DIAGNOSIS — F332 Major depressive disorder, recurrent severe without psychotic features: Principal | ICD-10-CM | POA: Diagnosis present

## 2024-01-02 DIAGNOSIS — Z79899 Other long term (current) drug therapy: Secondary | ICD-10-CM | POA: Diagnosis not present

## 2024-01-02 DIAGNOSIS — Z630 Problems in relationship with spouse or partner: Secondary | ICD-10-CM | POA: Diagnosis not present

## 2024-01-02 DIAGNOSIS — Z9152 Personal history of nonsuicidal self-harm: Secondary | ICD-10-CM

## 2024-01-02 LAB — URINE DRUG SCREEN
Amphetamines: NEGATIVE
Barbiturates: NEGATIVE
Benzodiazepines: NEGATIVE
Cocaine: NEGATIVE
Fentanyl: NEGATIVE
Methadone Scn, Ur: NEGATIVE
Opiates: NEGATIVE
Tetrahydrocannabinol: NEGATIVE

## 2024-01-02 LAB — CBC
HCT: 49.4 % (ref 39.0–52.0)
Hemoglobin: 17.5 g/dL — ABNORMAL HIGH (ref 13.0–17.0)
MCH: 30.6 pg (ref 26.0–34.0)
MCHC: 35.4 g/dL (ref 30.0–36.0)
MCV: 86.4 fL (ref 80.0–100.0)
Platelets: 243 K/uL (ref 150–400)
RBC: 5.72 MIL/uL (ref 4.22–5.81)
RDW: 12.2 % (ref 11.5–15.5)
WBC: 5.3 K/uL (ref 4.0–10.5)
nRBC: 0 % (ref 0.0–0.2)

## 2024-01-02 LAB — COMPREHENSIVE METABOLIC PANEL WITH GFR
ALT: 42 U/L (ref 0–44)
AST: 33 U/L (ref 15–41)
Albumin: 4.7 g/dL (ref 3.5–5.0)
Alkaline Phosphatase: 58 U/L (ref 38–126)
Anion gap: 11 (ref 5–15)
BUN: 10 mg/dL (ref 6–20)
CO2: 25 mmol/L (ref 22–32)
Calcium: 9.8 mg/dL (ref 8.9–10.3)
Chloride: 102 mmol/L (ref 98–111)
Creatinine, Ser: 0.98 mg/dL (ref 0.61–1.24)
GFR, Estimated: >60 mL/min (ref >60)
Glucose, Bld: 101 mg/dL — ABNORMAL HIGH (ref 70–99)
Potassium: 4.1 mmol/L (ref 3.5–5.1)
Sodium: 138 mmol/L (ref 135–145)
Total Bilirubin: 0.4 mg/dL (ref 0.0–1.2)
Total Protein: 8 g/dL (ref 6.5–8.1)

## 2024-01-02 LAB — ETHANOL: Alcohol, Ethyl (B): <15 mg/dL (ref <15)

## 2024-01-02 MED ORDER — HALOPERIDOL 5 MG PO TABS: 5.0000 mg | ORAL_TABLET | Freq: Three times a day (TID) | ORAL | Status: DC | PRN

## 2024-01-02 MED ORDER — DIPHENHYDRAMINE HCL 50 MG/ML IJ SOLN: 50.0000 mg | Freq: Three times a day (TID) | INTRAMUSCULAR | Status: DC | PRN

## 2024-01-02 MED ORDER — MAGNESIUM HYDROXIDE 400 MG/5ML PO SUSP: 30.0000 mL | Freq: Every day | ORAL | Status: DC | PRN

## 2024-01-02 MED ORDER — ALUM & MAG HYDROXIDE-SIMETH 200-200-20 MG/5ML PO SUSP: 30.0000 mL | ORAL | Status: DC | PRN

## 2024-01-02 MED ORDER — TRAZODONE HCL 50 MG PO TABS
50.0000 mg | ORAL_TABLET | Freq: Every evening | ORAL | Status: DC | PRN
  Administered 2024-01-06: 50 mg via ORAL
  Filled 2024-01-02: qty 1

## 2024-01-02 MED ORDER — HALOPERIDOL LACTATE 5 MG/ML IJ SOLN: 10.0000 mg | Freq: Three times a day (TID) | INTRAMUSCULAR | Status: DC | PRN

## 2024-01-02 MED ORDER — HYDROXYZINE HCL 25 MG PO TABS: 25.0000 mg | ORAL_TABLET | Freq: Three times a day (TID) | ORAL | Status: DC | PRN

## 2024-01-02 MED ORDER — LORAZEPAM 2 MG/ML IJ SOLN: 2.0000 mg | Freq: Three times a day (TID) | INTRAMUSCULAR | Status: DC | PRN

## 2024-01-02 MED ORDER — CITALOPRAM HYDROBROMIDE 20 MG PO TABS
20.0000 mg | ORAL_TABLET | Freq: Every day | ORAL | Status: DC
Start: 2024-01-03 — End: 2024-01-03
  Administered 2024-01-03: 20 mg via ORAL
  Filled 2024-01-02: qty 1

## 2024-01-02 MED ORDER — ACETAMINOPHEN 325 MG PO TABS: 650.0000 mg | ORAL_TABLET | Freq: Four times a day (QID) | ORAL | Status: DC | PRN

## 2024-01-02 MED ORDER — HYDROXYZINE HCL 25 MG PO TABS
25.0000 mg | ORAL_TABLET | Freq: Three times a day (TID) | ORAL | Status: DC | PRN
  Administered 2024-01-02 – 2024-01-03 (×2): 25 mg via ORAL
  Filled 2024-01-02 (×2): qty 1

## 2024-01-02 MED ORDER — HALOPERIDOL LACTATE 5 MG/ML IJ SOLN: 5.0000 mg | Freq: Three times a day (TID) | INTRAMUSCULAR | Status: DC | PRN

## 2024-01-02 MED ORDER — CITALOPRAM HYDROBROMIDE 10 MG PO TABS
20.0000 mg | ORAL_TABLET | Freq: Every day | ORAL | Status: DC
  Administered 2024-01-02: 20 mg via ORAL
  Filled 2024-01-02: qty 2

## 2024-01-02 MED ORDER — DIPHENHYDRAMINE HCL 25 MG PO CAPS: 50.0000 mg | ORAL_CAPSULE | Freq: Three times a day (TID) | ORAL | Status: DC | PRN

## 2024-01-02 NOTE — Plan of Care (Signed)
  Problem: Education: Goal: Knowledge of  General Education information/materials will improve Outcome: Not Applicable Goal: Emotional status will improve Outcome: Not Applicable Goal: Mental status will improve Outcome: Not Applicable Goal: Verbalization of understanding the information provided will improve Outcome: Not Applicable   Problem: Activity: Goal: Interest or engagement in activities will improve Outcome: Not Applicable Goal: Sleeping patterns will improve Outcome: Not Applicable   Problem: Coping: Goal: Ability to verbalize frustrations and anger appropriately will improve Outcome: Not Applicable Goal: Ability to demonstrate self-control will improve Outcome: Not Applicable   Problem: Health Behavior/Discharge Planning: Goal: Identification of resources available to assist in meeting health care needs will improve Outcome: Not Applicable Goal: Compliance with treatment plan for underlying cause of condition will improve Outcome: Not Applicable   Problem: Physical Regulation: Goal: Ability to maintain clinical measurements within normal limits will improve Outcome: Not Applicable   Problem: Safety: Goal: Periods of time without injury will increase Outcome: Not Applicable  As Jacob Wolf was just admitted, progress cannot yet be measured.
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
## 2024-01-02 NOTE — Progress Notes (Signed)
   01/02/24 2200  Psych Admission Type (Psych Patients Only)  Admission Status Involuntary  Psychosocial Assessment  Patient Complaints Sadness;Crying spells  Eye Contact Fair  Facial Expression Animated;Sad;Trembling lip  Affect Appropriate to circumstance  Speech Logical/coherent  Interaction Assertive  Motor Activity Other (Comment) (appropriate for developmental age)  Appearance/Hygiene Unremarkable  Behavior Characteristics Cooperative  Mood Sad;Pleasant  Thought Process  Coherency WDL  Content WDL  Delusions None reported or observed  Perception WDL  Hallucination None reported or observed  Judgment WDL  Confusion None  Danger to Self  Current suicidal ideation? Denies  Self-Injurious Behavior No self-injurious ideation or behavior indicators observed or expressed   Danger to Others  Danger to Others None reported or observed

## 2024-01-02 NOTE — ED Notes (Signed)
 Pt is resting.

## 2024-01-02 NOTE — Tx Team (Signed)
 Initial Treatment Plan 01/02/2024 6:37 PM Akon C Hemme [id number]    PATIENT STRESSORS: Financial difficulties   Marital or family conflict     PATIENT STRENGTHS: Ability for insight  Motivation for treatment/growth    PATIENT IDENTIFIED PROBLEMS: Unsteady marriage with wife  Financial difficulties                   DISCHARGE CRITERIA:  Adequate post-discharge living arrangements Improved stabilization in mood, thinking, and/or behavior Verbal commitment to aftercare and medication compliance  PRELIMINARY DISCHARGE PLAN: Attend aftercare/continuing care group Outpatient therapy  PATIENT/FAMILY INVOLVEMENT: This treatment plan has been presented to and reviewed with the patient, Baer C Thall.  The patient and family have been given the opportunity to ask questions and make suggestions.  Rihaan Barrack, RN 01/02/2024, 6:37 PM

## 2024-01-02 NOTE — ED Notes (Addendum)
 The patient has been moved to room 25 to have a private TTS assessment.

## 2024-01-02 NOTE — Progress Notes (Signed)
   01/02/24 1700  Psych Admission Type (Psych Patients Only)  Admission Status Involuntary  Psychosocial Assessment  Patient Complaints Sadness  Eye Contact Fair  Facial Expression Other (Comment) (WNL)  Affect Other (Comment) (WNL)  Speech Logical/coherent  Interaction Assertive  Motor Activity Other (Comment) (WNL)  Appearance/Hygiene Unremarkable  Behavior Characteristics Cooperative;Calm  Mood Sad;Pleasant  Aggressive Behavior  Effect No apparent injury  Thought Process  Coherency WDL  Content WDL  Delusions None reported or observed  Perception WDL  Hallucination None reported or observed  Judgment WDL  Confusion None  Danger to Self  Current suicidal ideation? Denies  Self-Injurious Behavior No self-injurious ideation or behavior indicators observed or expressed   Agreement Not to Harm Self Yes  Description of Agreement Verbal  Danger to Others  Danger to Others None reported or observed    Jacob Wolf presents after being IVC'd for SI with plan to strangle self. Is not reporting SI at this time, and states that he feels it was unfairly filed against him by his wife. Reports long history of unsteady relationship with wife going back 7-8 years. Reports that he got drunk on Halloween night and made suicidal statements, but no issue occurred at that time. States then that the night of 11/6 he and his wife were visited by the behavioral health man and were cleared. States that wife left his home for 30 minutes 11/7 and then returned with police who removed him via IVC order. Hx of SI with same plan in January 2024--discovered that wife was unfaithful and triggered the suicide plan. States that he wants to get his medications steady and work on himself while here, and was supported and encouraged to do so. Denies SH/HI/AVH.

## 2024-01-02 NOTE — Progress Notes (Signed)
 Pt was accepted to [facility] ON 01/02/2024 . Bed assignment:[id number]   Pt meets inpatient criteria per Ellouise Dawn, NP   Attending Physician will be: Dr.Jadepalle    Report can be called to: [phone]  Pt can arrive this afternoon   Care Team Notified: [facility] Garrett, RN, Randolm Bolognese, Paramedic, Ellouise Dawn, NP

## 2024-01-02 NOTE — ED Notes (Signed)
 Pt is laying in  bed.  Pt is looking at ceiling

## 2024-01-02 NOTE — BH Assessment (Signed)
 Clinician messaged Melanie A. Ousterman, RN: Hey. It's Jacob Wolf with TTS. Is the pt able to engage in the assessment, if so the pt will need to be placed in a private room. Is the pt under IVC? Also is the pt medically cleared?   Clinician awaiting response.   Jacob JONETTA Broach, MS, [facility], [facility] Triage Specialist [phone]
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
## 2024-01-02 NOTE — Group Note (Signed)
 Recreation Therapy Group Note   Group Topic:General Recreation  Group Date: 01/02/2024 Start Time: 1500 End Time: 1600 Facilitators: Celestia Jeoffrey BRAVO, LRT, CTRS Location: Courtyard  Group Description: [employer]. LRT and patients played games of basketball, drew with chalk, and played corn hole while outside in the courtyard while getting fresh air and sunlight. Music was being played in the background. LRT and peers conversed about different games they have played before, what they do in their free time and anything else that is on their minds. LRT encouraged pts to drink water after being outside, sweating and getting their heart rate up.  Goal Area(s) Addressed: Patient will build on frustration tolerance skills. Patients will partake in a competitive play game with peers. Patients will gain knowledge of new leisure interest/hobby.    Affect/Mood: N/A   Participation Level: Did not attend    Clinical Observations/Individualized Feedback: Patient did not attend group.   Plan: Continue to engage patient in RT group sessions 2-3x/week.   Jeoffrey BRAVO Celestia, LRT, CTRS 01/02/2024 5:04 PM

## 2024-01-02 NOTE — ED Notes (Signed)
 Pt changed into burgundy scrubs. Pt belongings placed in [place] [facility] cabinet at 9-12 nurses station. Bagged belongings include clothing, shoes, socks, phone, gold chain, and earrings. Pt wanded by security

## 2024-01-02 NOTE — Progress Notes (Signed)
   01/02/24 1800  Spiritual Encounters  Type of Visit Initial  Care provided to: Patient  Conversation partners present during encounter Nurse  Referral source Nurse (RN/NT/LPN)  Reason for visit Urgent spiritual support  OnCall Visit Yes  Spiritual Framework  Presenting Themes Coping tools;Impactful experiences and emotions;Community and relationships  Patient Stress Factors Family relationships  Interventions  Spiritual Care Interventions Made Established relationship of care and support;Compassionate presence;Reflective listening;Narrative/life review;Encouragement;Reconciliation with self/others   Visited with patient upon clinical staff request.  Patient recently admitted, and wanted to talk with someone, particularly regarding his relationships with his wife and his family.  Discussed related trauma issues from his past.  Offered compassionate presence, listening and support.  Chaplain shared trauma issues with the clinical staff to make them aware.

## 2024-01-02 NOTE — ED Notes (Signed)
 Pt is sleeping
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
## 2024-01-03 DIAGNOSIS — F332 Major depressive disorder, recurrent severe without psychotic features: Secondary | ICD-10-CM

## 2024-01-03 MED ORDER — SERTRALINE HCL 25 MG PO TABS
50.0000 mg | ORAL_TABLET | Freq: Every day | ORAL | Status: DC
  Administered 2024-01-04 – 2024-01-08 (×5): 50 mg via ORAL
  Filled 2024-01-03 (×5): qty 2

## 2024-01-03 MED ORDER — HYDROXYZINE HCL 50 MG PO TABS
50.0000 mg | ORAL_TABLET | Freq: Three times a day (TID) | ORAL | Status: DC | PRN
  Administered 2024-01-03 – 2024-01-08 (×10): 50 mg via ORAL
  Filled 2024-01-03 (×11): qty 1

## 2024-01-03 MED ORDER — SERTRALINE HCL 25 MG PO TABS: 50.0000 mg | ORAL_TABLET | Freq: Every day | ORAL | Status: DC
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
## 2024-01-03 NOTE — Plan of Care (Signed)
   Problem: Education: Goal: Ability to state activities that reduce stress will improve Outcome: Progressing

## 2024-01-03 NOTE — Group Note (Signed)
 Date:  01/03/2024 Time:  7:16 PM  Group Topic/Focus:  Activity Group: The focus of the group is to encourage patients to go outside to the courtyard and get some fresh air and some exercise.    Participation Level:  Active  Participation Quality:  Appropriate  Affect:  Appropriate  Cognitive:  Appropriate  Insight: Appropriate  Engagement in Group:  Engaged  Modes of Intervention:  Activity  Additional Comments:    [name] Terrisha Lopata 01/03/2024, 7:16 PM

## 2024-01-03 NOTE — Progress Notes (Signed)
   01/03/24 2200  Psych Admission Type (Psych Patients Only)  Admission Status Involuntary  Psychosocial Assessment  Patient Complaints None  Eye Contact Fair  Facial Expression Anxious  Affect Appropriate to circumstance  Speech Logical/coherent  Interaction Assertive  Motor Activity Other (Comment) (WDL)  Appearance/Hygiene Unremarkable  Behavior Characteristics Appropriate to situation;Cooperative  Mood Anxious  Aggressive Behavior  Effect No apparent injury  Thought Process  Coherency WDL  Content WDL  Delusions None reported or observed  Perception WDL  Hallucination None reported or observed  Judgment WDL  Confusion None  Danger to Self  Current suicidal ideation? Denies  Agreement Not to Harm Self Yes  Description of Agreement Verbal  Danger to Others  Danger to Others None reported or observed
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
## 2024-01-03 NOTE — Group Note (Signed)
 Date:  01/03/2024 Time:  6:46 PM  Group Topic/Focus:  Overcoming Stress:   The focus of this group is to define stress and help patients assess their triggers.    Participation Level:  Active  Participation Quality:  Appropriate  Affect:  Appropriate  Cognitive:  Alert  Insight: Appropriate  Engagement in Group:  Engaged  Modes of Intervention:  Activity, Discussion, and Education  Additional Comments:    Skippy LITTIE Bennett 01/03/2024, 6:46 PM

## 2024-01-03 NOTE — [facility] Suicide Risk Assessment (Signed)
 [facility] INPATIENT:  Family/Significant Other Suicide Prevention Education  Suicide Prevention Education:  Education Completed; chris Slagel, dad, [phone],  has been identified by the patient as the family member/significant other with whom the patient will be residing, and identified as the person(s) who will aid the patient in the event of a mental health crisis (suicidal ideations/suicide attempt).  With written consent from the patient, the family member/significant other has been provided the following suicide prevention education, prior to the and/or following the discharge of the patient.  The suicide prevention education provided includes the following: Suicide risk factors Suicide prevention and interventions National Suicide Hotline telephone number [facility] assessment telephone number [facility] Emergency Assistance 911 [facility] and/or Residential Mobile Crisis Unit telephone number  Request made of family/significant other to: Remove weapons (e.g., guns, rifles, knives), all items previously/currently identified as safety concern.   Remove drugs/medications (over-the-counter, prescriptions, illicit drugs), all items previously/currently identified as a safety concern.  The family member/significant other verbalizes understanding of the suicide prevention education information provided.  The family member/significant other agrees to remove the items of safety concern listed above.  Roselyn GORMAN Lento 01/03/2024, 5:00 PM

## 2024-01-03 NOTE — Group Note (Signed)
 LCSW Group Therapy Note   Group Date: 01/03/2024 Start Time: 1335 End Time: 1432   Type of Therapy and Topic:  Group Therapy: Identifying Stressors and Supports  Participation Level:  Active      Summary of Patient Progress:  Patient was present/active throughout the session and proved open to feedback from CSW and peers. Patient demonstrated fair insight into the subject matter, was respectful of peers, and was present throughout the entire session.     Roselyn GORMAN Lento, LCSWA 01/03/2024  2:43 PM

## 2024-01-04 DIAGNOSIS — F332 Major depressive disorder, recurrent severe without psychotic features: Secondary | ICD-10-CM | POA: Diagnosis not present

## 2024-01-04 NOTE — Plan of Care (Signed)
   Problem: Education: Goal: Ability to state activities that reduce stress will improve Outcome: Progressing

## 2024-01-04 NOTE — Group Note (Signed)
 Date:  01/04/2024 Time:  4:53 AM  Group Topic/Focus:  Identifying Needs:   The focus of this group is to help patients identify their personal needs that have been historically problematic and identify healthy behaviors to address their needs. Making Healthy Choices:   The focus of this group is to help patients identify negative/unhealthy choices they were using prior to admission and identify positive/healthier coping strategies to replace them upon discharge. Overcoming Stress:   The focus of this group is to define stress and help patients assess their triggers. Wrap-Up Group:   The focus of this group is to help patients review their daily goal of treatment and discuss progress on daily workbooks.    Participation Level:  Active  Participation Quality:  Appropriate and Attentive  Affect:  Appropriate  Cognitive:  Alert, Appropriate, and Oriented  Insight: Appropriate  Engagement in Group:  Engaged  Modes of Intervention:  Discussion and Support  Additional Comments:  N/A  Butler LITTIE Gelineau 01/04/2024, 4:53 AM

## 2024-01-04 NOTE — Plan of Care (Signed)
  Problem: Education: Goal: Ability to state activities that reduce stress will improve 01/04/2024 1714 by Shirley Jon FALCON, RN Outcome: Progressing 01/04/2024 1714 by Shirley Jon FALCON, RN Outcome: Progressing

## 2024-01-04 NOTE — Group Note (Signed)
 Date:  01/04/2024 Time:  10:13 AM  Group Topic/Focus:  Goals Group:   The focus of this group is to help patients establish daily goals to achieve during treatment and discuss how the patient can incorporate goal setting into their daily lives to aide in recovery.    Participation Level:  Did Not Attend   Skippy LITTIE Bennett 01/04/2024, 10:13 AM
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
## 2024-01-04 NOTE — Group Note (Signed)
 Date:  01/04/2024 Time:  9:08 PM  Group Topic/Focus:  Making Healthy Choices:   The focus of this group is to help patients identify negative/unhealthy choices they were using prior to admission and identify positive/healthier coping strategies to replace them upon discharge. Wrap-Up Group:   The focus of this group is to help patients review their daily goal of treatment and discuss progress on daily workbooks.    Participation Level:  Active  Participation Quality:  Appropriate  Affect:  Appropriate  Cognitive:  Alert, Appropriate, and Oriented  Insight: Appropriate  Engagement in Group:  Engaged  Modes of Intervention:  Discussion and Support  Additional Comments:  N/A  Butler LITTIE Gelineau 01/04/2024, 9:08 PM

## 2024-01-04 NOTE — Progress Notes (Signed)
   01/04/24 0820  Psych Admission Type (Psych Patients Only)  Admission Status Involuntary  Psychosocial Assessment  Patient Complaints None  Eye Contact Fair  Facial Expression Animated  Affect Appropriate to circumstance  Speech Logical/coherent  Interaction Assertive  Motor Activity Other (Comment) (WNL)  Appearance/Hygiene Unremarkable  Behavior Characteristics Appropriate to situation;Cooperative  Mood Pleasant  Aggressive Behavior  Effect No apparent injury  Thought Process  Coherency WDL  Content WDL  Delusions None reported or observed  Perception WDL  Hallucination None reported or observed  Judgment WDL  Confusion None  Danger to Self  Current suicidal ideation? Denies  Description of Suicide Plan no plan  Self-Injurious Behavior No self-injurious ideation or behavior indicators observed or expressed   Agreement Not to Harm Self Yes  Description of Agreement Verbal

## 2024-01-05 DIAGNOSIS — F332 Major depressive disorder, recurrent severe without psychotic features: Secondary | ICD-10-CM | POA: Diagnosis not present

## 2024-01-05 NOTE — Group Note (Signed)
 Date:  01/05/2024 Time:  6:58 PM  Group Topic/Focus:  Early Warning Signs:   The focus of this group is to help patients identify signs or symptoms they exhibit before slipping into an unhealthy state or crisis.    Participation Level:  Did Not Attend   [name] Jacob Wolf 01/05/2024, 6:58 PM

## 2024-01-05 NOTE — Plan of Care (Signed)
  Problem: Education: Goal: Ability to state activities that reduce stress will improve 01/05/2024 0959 by Waddell Oddis BIRCH, RN Outcome: Progressing 01/05/2024 0958 by Waddell Oddis BIRCH, RN Outcome: Progressing   Problem: Self-Concept: Goal: Level of anxiety will decrease Outcome: Progressing Goal: Ability to modify response to factors that promote anxiety will improve Outcome: Progressing

## 2024-01-05 NOTE — Group Note (Signed)
 [facility] LCSW Group Therapy Note    Group Date: 01/05/2024 Start Time: 1300 End Time: 1400  Type of Therapy and Topic:  Group Therapy:  Overcoming Obstacles  Participation Level:  [facility] PARTICIPATION LEVEL: Did Not Attend   Description of Group:   In this group patients will be encouraged to explore what they see as obstacles to their own wellness and recovery. They will be guided to discuss their thoughts, feelings, and behaviors related to these obstacles. The group will process together ways to cope with barriers, with attention given to specific choices patients can make. Each patient will be challenged to identify changes they are motivated to make in order to overcome their obstacles. This group will be process-oriented, with patients participating in exploration of their own experiences as well as giving and receiving support and challenge from other group members.  Therapeutic Goals: 1. Patient will identify personal and current obstacles as they relate to admission. 2. Patient will identify barriers that currently interfere with their wellness or overcoming obstacles.  3. Patient will identify feelings, thought process and behaviors related to these barriers. 4. Patient will identify two changes they are willing to make to overcome these obstacles:    Summary of Patient Progress Patient did not attend group.    Therapeutic Modalities:   Cognitive Behavioral Therapy Solution Focused Therapy Motivational Interviewing Relapse Prevention Therapy   Nadara JONELLE Fam, LCSW

## 2024-01-05 NOTE — Progress Notes (Signed)
   01/05/24 2300  Psych Admission Type (Psych Patients Only)  Admission Status Voluntary  Psychosocial Assessment  Patient Complaints None  Eye Contact Fair  Facial Expression Animated  Affect Appropriate to circumstance  Speech Logical/coherent  Interaction Assertive  Motor Activity Slow  Appearance/Hygiene Unremarkable  Behavior Characteristics Cooperative  Mood Pleasant  Aggressive Behavior  Effect No apparent injury  Thought Process  Coherency WDL  Content WDL  Delusions None reported or observed  Perception WDL  Hallucination None reported or observed  Judgment WDL  Confusion WDL  Danger to Self  Current suicidal ideation? Denies  Agreement Not to Harm Self Yes  Description of Agreement verbal  Danger to Others  Danger to Others None reported or observed

## 2024-01-05 NOTE — Progress Notes (Signed)
   01/05/24 0949  Psych Admission Type (Psych Patients Only)  Admission Status Voluntary  Psychosocial Assessment  Patient Complaints None  Eye Contact Fair  Facial Expression Animated  Affect Appropriate to circumstance  Speech Logical/coherent  Interaction Assertive  Motor Activity Slow  Appearance/Hygiene Unremarkable  Behavior Characteristics Cooperative;Appropriate to situation  Mood Pleasant;Anxious  Aggressive Behavior  Effect No apparent injury  Thought Process  Coherency WDL  Content WDL  Delusions None reported or observed  Perception WDL  Hallucination None reported or observed  Judgment WDL  Confusion WDL  Danger to Self  Current suicidal ideation? Denies  Self-Injurious Behavior No self-injurious ideation or behavior indicators observed or expressed   Agreement Not to Harm Self Yes  Description of Agreement verbal  Danger to Others  Danger to Others None reported or observed

## 2024-01-05 NOTE — Plan of Care (Signed)
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
## 2024-01-05 NOTE — Progress Notes (Signed)
   01/04/24 2027  Psych Admission Type (Psych Patients Only)  Admission Status Involuntary  Psychosocial Assessment  Patient Complaints None  Eye Contact Fair  Facial Expression Animated  Affect Appropriate to circumstance  Speech Logical/coherent  Interaction Assertive  Motor Activity Other (Comment) (WDL)  Appearance/Hygiene Unremarkable  Behavior Characteristics Appropriate to situation;Cooperative  Mood Pleasant  Aggressive Behavior  Effect No apparent injury  Thought Process  Coherency WDL  Content WDL  Delusions None reported or observed  Perception WDL  Hallucination None reported or observed  Judgment WDL  Confusion None  Danger to Self  Current suicidal ideation? Denies  Agreement Not to Harm Self Yes  Description of Agreement Verbal  Danger to Others  Danger to Others None reported or observed   Pt was visited by a gentlemen he identified as his friend.  Told staff that his friend is supportive.  Pt visible in milieu and interacting with staff.  He denied SI.

## 2024-01-05 NOTE — Plan of Care (Signed)
   Problem: Education: Goal: Ability to state activities that reduce stress will improve Outcome: Progressing

## 2024-01-05 NOTE — Group Note (Signed)
 Date:  01/05/2024 Time:  7:02 PM  Group Topic/Focus:  Activity Group: The focus of the group is to encourage patients to go outside to the courtyard and get some fresh air and some exercise.    Participation Level:  Active  Participation Quality:  Appropriate  Affect:  Appropriate  Cognitive:  Appropriate  Insight: Appropriate  Engagement in Group:  Engaged  Modes of Intervention:  Activity  Additional Comments:    Jacob Wolf 01/05/2024, 7:02 PM

## 2024-01-05 NOTE — Group Note (Signed)
 Date:  01/05/2024 Time:  11:10 PM  Group Topic/Focus:  Wrap-Up Group:   The focus of this group is to help patients review their daily goal of treatment and discuss progress on daily workbooks.    Participation Level:  Active  Participation Quality:  Appropriate and Attentive  Affect:  Appropriate  Cognitive:  Alert and Appropriate  Insight: Appropriate and Good  Engagement in Group:  Engaged  Modes of Intervention:  Orientation  Additional Comments:     Jacob Wolf 01/05/2024, 11:10 PM

## 2024-01-05 NOTE — Group Note (Signed)
 Recreation Therapy Group Note   Group Topic:Healthy Support Systems  Group Date: 01/05/2024 Start Time: 1040 End Time: 1135 Facilitators: Celestia Jeoffrey BRAVO, LRT, CTRS Location: Craft Room  Group Description: Straw Bridge. In groups or individually, patients were given 10 plastic drinking straws and an equal length of masking tape. Using the materials provided, patients were instructed to build a free-standing bridge-like structure to suspend an everyday item (ex: deck of cards) off the floor or table surface. All materials were required to be used in secondary school teacher. LRT facilitated post-activity discussion reviewing the importance of having strong and healthy support systems in our lives. LRT discussed how the people in our lives serve as the tape and the deck of cards we placed on top of our straw structure are the stressors we face in daily life. LRT and pts discussed what happens in our life when things get too heavy for us , and we don't have strong supports outside of the hospital. Pt shared 2 of their healthy supports in their life aloud in the group.   Goal Area(s) Addressed:  Patient will identify 2 healthy supports in their life. Patient will identify skills to successfully complete activity. Patient will identify correlation of this activity to life post-discharge.  Patient will build on frustration tolerance skills. Patient will increase team building and communication skills.    Affect/Mood: N/A   Participation Level: Did not attend    Clinical Observations/Individualized Feedback: Patient did not attend group.   Plan: Continue to engage patient in RT group sessions 2-3x/week.   Jeoffrey BRAVO Celestia, LRT, CTRS 01/05/2024 1:45 PM

## 2024-01-05 NOTE — BH IP Treatment Plan (Signed)
 Interdisciplinary Treatment and Diagnostic Plan Update  01/05/2024 Time of Session: 10:19 Jacob Wolf MRN: [id number]  Principal Diagnosis: MDD (major depressive disorder), recurrent episode, severe (HCC)  Secondary Diagnoses: Principal Problem:   MDD (major depressive disorder), recurrent episode, severe (HCC)   Current Medications:  Current Facility-Administered Medications  Medication Dose Route Frequency Provider Last Rate Last Admin   acetaminophen  (TYLENOL ) tablet 650 mg  650 mg Oral Q6H PRN Allen, Tina L, FNP       alum & mag hydroxide-simeth (MAALOX/MYLANTA) 200-200-20 MG/5ML suspension 30 mL  30 mL Oral Q4H PRN Allen, Tina L, FNP       haloperidol (HALDOL) tablet 5 mg  5 mg Oral TID PRN Dasie Ellouise CROME, FNP       And   diphenhydrAMINE (BENADRYL) capsule 50 mg  50 mg Oral TID PRN Allen, Tina L, FNP       haloperidol lactate (HALDOL) injection 5 mg  5 mg Intramuscular TID PRN Dasie Ellouise CROME, FNP       And   diphenhydrAMINE (BENADRYL) injection 50 mg  50 mg Intramuscular TID PRN Dasie Ellouise CROME, FNP       And   LORazepam  (ATIVAN ) injection 2 mg  2 mg Intramuscular TID PRN Dasie Ellouise CROME, FNP       haloperidol lactate (HALDOL) injection 10 mg  10 mg Intramuscular TID PRN Dasie Ellouise CROME, FNP       And   diphenhydrAMINE (BENADRYL) injection 50 mg  50 mg Intramuscular TID PRN Dasie Ellouise CROME, FNP       And   LORazepam  (ATIVAN ) injection 2 mg  2 mg Intramuscular TID PRN Dasie Ellouise CROME, FNP       hydrOXYzine  (ATARAX ) tablet 50 mg  50 mg Oral Q8H PRN Millington, Matthew E, PA-C   50 mg at 01/05/24 9073   magnesium  hydroxide (MILK OF MAGNESIA) suspension 30 mL  30 mL Oral Daily PRN Dasie Ellouise CROME, FNP       sertraline (ZOLOFT) tablet 50 mg  50 mg Oral Daily Millington, Matthew E, PA-C   50 mg at 01/05/24 9077   traZODone  (DESYREL ) tablet 50 mg  50 mg Oral QHS PRN Allen, Tina L, FNP       PTA Medications: Medications Prior to Admission  Medication Sig Dispense Refill Last Dose/Taking    citalopram  (CELEXA ) 20 MG tablet Take 1 tablet (20 mg total) by mouth daily. 30 tablet 1    hydrOXYzine  (ATARAX ) 25 MG tablet Take 1 tablet (25 mg total) by mouth every 8 (eight) hours as needed for anxiety. 30 tablet 1     Patient Stressors: Financial difficulties   Marital or family conflict    Patient Strengths: Ability for insight  Motivation for treatment/growth   Treatment Modalities: Medication Management, Group therapy, Case management,  1 to 1 session with clinician, Psychoeducation, Recreational therapy.   Physician Treatment Plan for Primary Diagnosis: MDD (major depressive disorder), recurrent episode, severe (HCC) Long Term Goal(s):     Short Term Goals:    Medication Management: Evaluate patient's response, side effects, and tolerance of medication regimen.  Therapeutic Interventions: 1 to 1 sessions, Unit Group sessions and Medication administration.  Evaluation of Outcomes: Not Met  Physician Treatment Plan for Secondary Diagnosis: Principal Problem:   MDD (major depressive disorder), recurrent episode, severe (HCC)  Long Term Goal(s):     Short Term Goals:       Medication Management: Evaluate patient's response, side effects, and tolerance of medication  regimen.  Therapeutic Interventions: 1 to 1 sessions, Unit Group sessions and Medication administration.  Evaluation of Outcomes: Not Met   RN Treatment Plan for Primary Diagnosis: MDD (major depressive disorder), recurrent episode, severe (HCC) Long Term Goal(s): Knowledge of disease and therapeutic regimen to maintain health will improve  Short Term Goals: Ability to remain free from injury will improve, Ability to verbalize frustration and anger appropriately will improve, Ability to demonstrate self-control, Ability to participate in decision making will improve, Ability to verbalize feelings will improve, Ability to disclose and discuss suicidal ideas, Ability to identify and develop effective coping  behaviors will improve, and Compliance with prescribed medications will improve  Medication Management: RN will administer medications as ordered by provider, will assess and evaluate patient's response and provide education to patient for prescribed medication. RN will report any adverse and/or side effects to prescribing provider.  Therapeutic Interventions: 1 on 1 counseling sessions, Psychoeducation, Medication administration, Evaluate responses to treatment, Monitor vital signs and CBGs as ordered, Perform/monitor CIWA, COWS, AIMS and Fall Risk screenings as ordered, Perform wound care treatments as ordered.  Evaluation of Outcomes: Not Met   LCSW Treatment Plan for Primary Diagnosis: MDD (major depressive disorder), recurrent episode, severe (HCC) Long Term Goal(s): Safe transition to appropriate next level of care at discharge, Engage patient in therapeutic group addressing interpersonal concerns.  Short Term Goals: Engage patient in aftercare planning with referrals and resources, Increase social support, Increase ability to appropriately verbalize feelings, Increase emotional regulation, Facilitate acceptance of mental health diagnosis and concerns, Identify triggers associated with mental health/substance abuse issues, and Increase skills for wellness and recovery  Therapeutic Interventions: Assess for all discharge needs, 1 to 1 time with Social worker, Explore available resources and support systems, Assess for adequacy in community support network, Educate family and significant other(s) on suicide prevention, Complete Psychosocial Assessment, Interpersonal group therapy.  Evaluation of Outcomes: Not Met   Progress in Treatment: Attending groups: Yes. and No. Participating in groups: Yes. Taking medication as prescribed: Yes. Toleration medication: Yes. Family/Significant other contact made: Yes, individual(s) contacted:  dad, Orlanda Lemmerman.  Patient understands diagnosis:  Yes. Discussing patient identified problems/goals with staff: Yes. Medical problems stabilized or resolved: Yes. Denies suicidal/homicidal ideation: Yes. Issues/concerns per patient self-inventory: No. Other: none.   New problem(s) identified: No, Describe:  none identified.   New Short Term/Long Term Goal(s): medication management for mood stabilization; elimination of SI thoughts; development of comprehensive mental wellness plan.  Patient Goals:  Get a better mindset.  Discharge Plan or Barriers: CSW will assist pt with development of an appropriate aftercare/discharge plan.   Reason for Continuation of Hospitalization: Anxiety Depression Medication stabilization  Estimated Length of Stay: 1-7 days  Last 3 [place] Suicide Severity Risk Score: Flowsheet Row Admission (Current) from 01/02/2024 in [facility] ED from 01/01/2024 in [facility] Department [facility] from 12/18/2023 in [facility] at [facility] RISK CATEGORY Error: Q3, 4, or 5 should not be populated when Q2 is No High Risk No Risk    Last PHQ 2/9 Scores:     No data to display          Scribe for Treatment Team: Nadara JONELLE Fam, LCSW 01/05/2024 2:44 PM

## 2024-01-06 NOTE — Progress Notes (Signed)
   01/05/24 0949  Psych Admission Type (Psych Patients Only)  Admission Status Involuntary  Psychosocial Assessment  Patient Complaints None  Eye Contact Fair  Facial Expression Animated  Affect Appropriate to circumstance  Speech Logical/coherent  Interaction Assertive  Motor Activity Slow  Appearance/Hygiene Unremarkable  Behavior Characteristics Cooperative;Appropriate to situation  Mood Pleasant;Anxious  Aggressive Behavior  Effect No apparent injury  Thought Process  Coherency WDL  Content WDL  Delusions None reported or observed  Perception WDL  Hallucination None reported or observed  Judgment WDL  Confusion WDL  Danger to Self  Current suicidal ideation? Denies  Self-Injurious Behavior No self-injurious ideation or behavior indicators observed or expressed   Agreement Not to Harm Self Yes  Description of Agreement verbal  Danger to Others  Danger to Others None reported or observed

## 2024-01-06 NOTE — Group Note (Addendum)
 Recreation Therapy Group Note   Group Topic:Coping Skills  Group Date: 01/06/2024 Start Time: 1005 End Time: 1050 Facilitators: Celestia Jeoffrey BRAVO, LRT, CTRS Location: Craft Room  Group Description: Mind Map.  Patient was provided a blank template of a diagram with 32 blank boxes in a tiered system, branching from the center (similar to a bubble chart). LRT directed patients to label the middle of the diagram Coping Skills. LRT and patients then came up with 8 different coping skills as examples. Pt were directed to record their coping skills in the 2nd tier boxes closest to the center.  Patients would then share their coping skills with the group as LRT wrote them out. LRT gave a handout of 99 different coping skills at the end of group.   Goal Area(s) Addressed: Patients will be able to define "coping skills". Patient will identify new coping skills.  Patient will increase communication.   Affect/Mood: Appropriate    Participation Level: Active and Engaged    Participation Quality: Independent    Behavior: Appropriate, Calm, and Cooperative    Speech/Thought Process: Coherent    Insight: Good    Judgement: Good    Modes of Intervention: Clarification, Education, Worksheet, and Writing    Patient Response to Interventions:  Attentive, Engaged, Interested , and Receptive    Education Outcome:   Acknowledges education    Clinical Observations/Individualized Feedback: Jacob Wolf was active in their participation of session activities and group discussion. Pt identified career and sex as pharmacologist.     Plan: Continue to engage patient in RT group sessions 2-3x/week.   Jeoffrey BRAVO Celestia, LRT, CTRS 01/06/2024 1:43 PM
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
## 2024-01-06 NOTE — Plan of Care (Signed)
  Problem: Education: Goal: Ability to state activities that reduce stress will improve Outcome: Progressing   Problem: Self-Concept: Goal: Ability to identify factors that promote anxiety will improve Outcome: Progressing Goal: Level of anxiety will decrease Outcome: Progressing   

## 2024-01-06 NOTE — Group Note (Signed)
 [facility] LCSW Group Therapy Note   Group Date: 01/06/2024 Start Time: 1300 End Time: 1400   Type of Therapy/Topic:  Group Therapy:  Emotion Regulation  Participation Level:  Active   Mood: Appropriate   Description of Group:    The purpose of this group is to assist patients in learning to regulate negative emotions and experience positive emotions. Patients will be guided to discuss ways in which they have been vulnerable to their negative emotions. These vulnerabilities will be juxtaposed with experiences of positive emotions or situations, and patients challenged to use positive emotions to combat negative ones. Special emphasis will be placed on coping with negative emotions in conflict situations, and patients will process healthy conflict resolution skills.  Therapeutic Goals: Patient will identify two positive emotions or experiences to reflect on in order to balance out negative emotions:  Patient will label two or more emotions that they find the most difficult to experience:  Patient will be able to demonstrate positive conflict resolution skills through discussion or role plays:   Summary of Patient Progress:   During group, patient and group explored the ways in which our thoughts can impact our feelings which impacts our behaviors. Group along with facilitator completed a thermometer activity where different areas of life were explored. Participants were asked to notate in which zone these areas exist in on their personal thermometers. The group then discussed coping skills, and safety plans to help better prepare for potential stressors and learn to better emotionally regulate.    ?     Therapeutic Modalities:   Cognitive Behavioral Therapy Feelings Identification Dialectical Behavioral Therapy   Jacob CHRISTELLA Kerns, LCSW

## 2024-01-06 NOTE — Group Note (Signed)
 Date:  01/06/2024 Time:  9:12 PM  Group Topic/Focus:  Wrap-Up Group:   The focus of this group is to help patients review their daily goal of treatment and discuss progress on daily workbooks.    Participation Level:  Active  Participation Quality:  Appropriate and Attentive  Affect:  Appropriate  Cognitive:  Alert and Appropriate  Insight: Appropriate and Good  Engagement in Group:  Engaged  Modes of Intervention:  Reality Testing  Additional Comments:     Jacob Wolf 01/06/2024, 9:12 PM

## 2024-01-06 NOTE — Group Note (Signed)
 Date:  01/06/2024 Time:  6:46 PM  Group Topic/Focus:   Structured Activity  Participation Level:  Did Not Attend  Gricelda Foland A Althia Egolf 01/06/2024, 6:46 PM

## 2024-01-06 NOTE — Progress Notes (Signed)
   01/06/24 0929  Psych Admission Type (Psych Patients Only)  Admission Status Involuntary  Psychosocial Assessment  Patient Complaints Anxiety  Eye Contact Fair  Facial Expression Animated  Affect Appropriate to circumstance  Speech Logical/coherent  Interaction Assertive  Motor Activity Slow  Appearance/Hygiene Unremarkable  Behavior Characteristics Cooperative;Appropriate to situation  Mood Pleasant  Aggressive Behavior  Effect No apparent injury  Thought Process  Coherency WDL  Content WDL  Delusions None reported or observed  Perception WDL  Hallucination None reported or observed  Judgment WDL  Confusion WDL  Danger to Self  Current suicidal ideation? Denies  Self-Injurious Behavior No self-injurious ideation or behavior indicators observed or expressed   Agreement Not to Harm Self Yes  Description of Agreement verbal  Danger to Others  Danger to Others None reported or observed

## 2024-01-06 NOTE — Progress Notes (Signed)
   01/06/24 2000  Psych Admission Type (Psych Patients Only)  Admission Status Involuntary  Psychosocial Assessment  Patient Complaints Anxiety  Eye Contact Fair  Facial Expression Animated  Affect Appropriate to circumstance  Speech Logical/coherent  Interaction Assertive  Motor Activity Slow  Appearance/Hygiene Unremarkable  Behavior Characteristics Cooperative;Appropriate to situation  Mood Pleasant  Aggressive Behavior  Effect No apparent injury  Thought Process  Coherency WDL  Content WDL  Delusions None reported or observed  Perception WDL  Hallucination None reported or observed  Judgment WDL  Confusion WDL  Danger to Self  Current suicidal ideation? Denies  Agreement Not to Harm Self Yes  Description of Agreement verbal  Danger to Others  Danger to Others None reported or observed

## 2024-01-06 NOTE — Plan of Care (Signed)
  Problem: Education: Goal: Ability to state activities that reduce stress will improve Outcome: Progressing   Problem: Education: Goal: Ability to state activities that reduce stress will improve Outcome: Progressing   Problem: Coping: Goal: Ability to identify and develop effective coping behavior will improve Outcome: Progressing

## 2024-01-07 NOTE — Group Note (Signed)
 [facility] LCSW Group Therapy Note   Group Date: 01/07/2024 Start Time: 1315 End Time: 1400   Type of Therapy/Topic:  Group Therapy:  Emotion Regulation  Participation Level:  Did Not Attend   Mood:  Description of Group:    The purpose of this group is to assist patients in learning to regulate negative emotions and experience positive emotions. Patients will be guided to discuss ways in which they have been vulnerable to their negative emotions. These vulnerabilities will be juxtaposed with experiences of positive emotions or situations, and patients challenged to use positive emotions to combat negative ones. Special emphasis will be placed on coping with negative emotions in conflict situations, and patients will process healthy conflict resolution skills.  Therapeutic Goals: Patient will identify two positive emotions or experiences to reflect on in order to balance out negative emotions:  Patient will label two or more emotions that they find the most difficult to experience:  Patient will be able to demonstrate positive conflict resolution skills through discussion or role plays:   Summary of Patient Progress:   X    Therapeutic Modalities:   Cognitive Behavioral Therapy Feelings Identification Dialectical Behavioral Therapy   Sherryle JINNY Margo, LCSW

## 2024-01-07 NOTE — Group Note (Signed)
 Date:  01/07/2024 Time:  1:41 PM  Group Topic/Focus:  Goals Group:   The focus of this group is to help patients establish daily goals to achieve during treatment and discuss how the patient can incorporate goal setting into their daily lives to aide in recovery.    Participation Level:  Did Not Attend   [name] Eeva Schlosser 01/07/2024, 1:41 PM

## 2024-01-07 NOTE — Plan of Care (Signed)
  Problem: Education: Goal: Ability to state activities that reduce stress will improve Outcome: Progressing   Problem: Education: Goal: Ability to state activities that reduce stress will improve Outcome: Progressing   Problem: Coping: Goal: Ability to identify and develop effective coping behavior will improve Outcome: Progressing

## 2024-01-07 NOTE — Progress Notes (Signed)
   01/07/24 2015  Psych Admission Type (Psych Patients Only)  Admission Status Involuntary  Psychosocial Assessment  Patient Complaints None  Eye Contact Fair  Facial Expression Animated  Affect Appropriate to circumstance  Speech Logical/coherent  Interaction Assertive  Motor Activity Other (Comment) (WNL)  Appearance/Hygiene In scrubs  Behavior Characteristics Cooperative;Appropriate to situation  Mood Pleasant  Thought Process  Coherency WDL  Content WDL  Delusions None reported or observed  Perception WDL  Hallucination None reported or observed  Judgment WDL  Confusion None  Danger to Self  Current suicidal ideation? Denies  Agreement Not to Harm Self Yes  Description of Agreement verbal  Danger to Others  Danger to Others None reported or observed
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
## 2024-01-07 NOTE — Plan of Care (Signed)
?  Problem: Education: ?Goal: Ability to state activities that reduce stress will improve ?Outcome: Progressing ?  ?Problem: Self-Concept: ?Goal: Level of anxiety will decrease ?Outcome: Progressing ?  ?

## 2024-01-07 NOTE — Group Note (Signed)
 Date:  01/07/2024 Time:  5:49 PM  Group Topic/Focus:  Wellness Toolbox:   The focus of this group is to discuss various aspects of wellness, balancing those aspects and exploring ways to increase the ability to experience wellness.  Patients will create a wellness toolbox for use upon discharge.    Participation Level:  Active  Participation Quality:  Appropriate  Affect:  Appropriate  Cognitive:  Appropriate  Insight: Appropriate  Engagement in Group:  Engaged  Modes of Intervention:  Activity and Socialization  Additional Comments:    Jacob Wolf 01/07/2024, 5:49 PM

## 2024-01-07 NOTE — Progress Notes (Signed)
   01/07/24 0834  Psych Admission Type (Psych Patients Only)  Admission Status Involuntary  Psychosocial Assessment  Patient Complaints Anxiety  Eye Contact Fair  Facial Expression Animated  Affect Appropriate to circumstance  Speech Logical/coherent  Interaction Assertive  Motor Activity Other (Comment) (WDL)  Appearance/Hygiene Unremarkable  Behavior Characteristics Cooperative;Appropriate to situation  Mood Pleasant  Thought Process  Coherency WDL  Content WDL  Delusions None reported or observed  Perception WDL  Hallucination None reported or observed  Judgment Poor  Confusion None  Danger to Self  Current suicidal ideation? Denies  Agreement Not to Harm Self Yes  Description of Agreement Verbal  Danger to Others  Danger to Others None reported or observed

## 2024-01-08 MED ORDER — HYDROXYZINE HCL 50 MG PO TABS: 50.0000 mg | ORAL_TABLET | Freq: Every day | ORAL | 0 refills | Status: AC | PRN

## 2024-01-08 MED ORDER — SERTRALINE HCL 50 MG PO TABS: 50.0000 mg | ORAL_TABLET | Freq: Every day | ORAL | 0 refills | Status: AC

## 2024-01-08 NOTE — Group Note (Signed)
 Date:  01/08/2024 Time:  4:03 AM  Group Topic/Focus:  Coping With Mental Health Crisis:   The purpose of this group is to help patients identify strategies for coping with mental health crisis.  Group discusses possible causes of crisis and ways to manage them effectively.    Participation Level:  Active  Participation Quality:  Attentive  Affect:  Appropriate  Cognitive:  Appropriate  Insight: Appropriate  Engagement in Group:  Engaged  Modes of Intervention:  Discussion  Additional Comments:    Leigh VEAR Pais 01/08/2024, 4:03 AM

## 2024-01-08 NOTE — Discharge Summary (Signed)
 Physician Discharge Summary Note  Patient:  Jacob Wolf is an 28 y.o., male MRN:  [id number] DOB:  12/04/1995 Patient phone:  [phone] (home)  Patient address:   [address] [place] [address],   Total time spent: 40 min Date of Admission:  01/02/2024 Date of Discharge: 01/08/2024  Reason for Admission:  Suicidal ideation   Principal Problem: MDD (major depressive disorder), recurrent episode, severe (HCC) Discharge Diagnoses: Principal Problem:   MDD (major depressive disorder), recurrent episode, severe (HCC)   Past Psychiatric History: see h&p  Family Psychiatric  History: see h&p Social History:  Social History   Substance and Sexual Activity  Alcohol Use No     Social History   Substance and Sexual Activity  Drug Use No    Social History   Socioeconomic History   Marital status: Married    Spouse name: Not on file   Number of children: Not on file   Years of education: Not on file   Highest education level: Not on file  Occupational History   Not on file  Tobacco Use   Smoking status: Never   Smokeless tobacco: Never  Vaping Use   Vaping status: Never Used  Substance and Sexual Activity   Alcohol use: No   Drug use: No   Sexual activity: Yes  Other Topics Concern   Not on file  Social History Narrative   [employer]   10 th grade.         Social Drivers of Corporate Investment Banker Strain: Not on file  Food Insecurity: Patient Declined (01/02/2024)   Hunger Vital Sign    Worried About Running Out of Food in the Last Year: Patient declined    Ran Out of Food in the Last Year: Patient declined  Transportation Needs: No Transportation Needs (01/02/2024)   PRAPARE - Administrator, Civil Service (Medical): No    Lack of Transportation (Non-Medical): No  Physical Activity: Not on file  Stress: Not on file  Social Connections: Not on file   Past Medical History:  Past Medical History:  Diagnosis Date   Asthma    Asthma,  mild intermittent 10/29/2011   Concussion     Past Surgical History:  Procedure Laterality Date   APPENDECTOMY     LAPAROSCOPIC APPENDECTOMY  04/16/2011   Procedure: APPENDECTOMY LAPAROSCOPIC;  Surgeon: CHRISTELLA. Julietta Millman, MD;  Location: [facility] OR;  Service: Pediatrics;  Laterality: N/A;   Lt toe removal     Family History: History reviewed. No pertinent family history.  Hospital Course:   Throughout admission, the patient remained medication compliant and maintained safe behaviors on the unit. Initially presenting with low engagement, the patient reported goals of improving his mindset and participated intermittently in group therapies and unit activities. He responded well to treatment with Zoloft and hydroxyzine . Over subsequent days, he demonstrated increasing engagement, stable mood, and improved insight. He consistently denied suicidal ideation, homicidal ideation, or hallucinations, and reported stable sleep and appetite. He remained linear, logical, and future-oriented, endorsing good symptom control without adverse medication effects. He was able to articulate coping strategies, identify social supports--including his daughter as a strong protective factor--and verbalize understanding of crisis resources. He denied access to firearms or other lethal means. At the time of discharge, he denied depressive symptoms, described his anxiety as well-controlled, and voiced no concerns.  Patient will be returning to live with his wife, Geni.  Safe discharge planning completed with Geni, who does not voice  any safety concerns and is agreeable to patient's discharge.  She confirms patient does not have access to guns or other lethal means.  Detailed risk assessment is complete based on clinical exam and individual risk factors and acute suicide risk is low and acute violence risk is low.    On the day of discharge, patient denies SI/HI/plan and denies hallucinations.  Patient remains future oriented and  is willing to participate in outpatient mental health services.  Currently, all modifiable risk of harm to self/harm to others have been addressed and patient is no longer appropriate for the acute inpatient setting and is able to continue treatment for mental health needs in the community with the supports as indicated below.  Patient is educated and verbalized understanding of discharge plan of care including medications, follow-up appointments, mental health resources and further crisis services in the community.  He is instructed to call 911 or present to the nearest emergency room should he experience any decompensation in mood, disturbance of bowel or return of suicidal/homicidal ideations.  Patient verbalizes understanding of this education and agrees to this plan of care  Physical Findings: AIMS:  , ,  ,  ,    CIWA:    COWS:      Psychiatric Specialty Exam:  Presentation  General Appearance:  Appropriate for Environment  Eye Contact: Good  Speech: Clear and Coherent  Speech Volume: Normal    Mood and Affect  Mood: Euthymic  Affect: Appropriate   Thought Process  Thought Processes: Coherent; Linear  Descriptions of Associations:Intact  Orientation:Full (Time, Place and Person)  Thought Content:Logical  Hallucinations:Hallucinations: None  Ideas of Reference:None  Suicidal Thoughts:Suicidal Thoughts: No  Homicidal Thoughts:Homicidal Thoughts: No   Sensorium  Memory: Immediate Fair; Recent Fair; Remote Fair  Judgment: Fair  Insight: Fair   Art Therapist  Concentration: Good  Attention Span: Good  Recall: Good  Fund of Knowledge: Good  Language: Good   Psychomotor Activity  Psychomotor Activity: Psychomotor Activity: Normal  Musculoskeletal: Strength & Muscle Tone: within normal limits Gait & Station: normal Assets  Assets: Manufacturing Systems Engineer; Desire for Improvement; Housing; Social Support; Physical  Health   Sleep  Sleep: Sleep: Good    Physical Exam: Physical Exam Vitals and nursing note reviewed.  Constitutional:      Appearance: Normal appearance.  HENT:     Head: Normocephalic and atraumatic.     Nose: Nose normal.  Eyes:     Conjunctiva/sclera: Conjunctivae normal.  Pulmonary:     Effort: Pulmonary effort is normal.  Neurological:     Mental Status: He is alert and oriented to person, place, and time.  Psychiatric:        Attention and Perception: Attention and perception normal. He does not perceive auditory or visual hallucinations.        Mood and Affect: Mood and affect normal.        Speech: Speech normal.        Behavior: Behavior is cooperative.        Thought Content: Thought content normal. Thought content does not include homicidal or suicidal ideation. Thought content does not include homicidal or suicidal plan.        Cognition and Memory: Cognition normal.        Judgment: Judgment normal.    Review of Systems  Psychiatric/Behavioral:  Negative for depression, hallucinations and suicidal ideas. The patient is nervous/anxious.    Blood pressure 119/77, pulse 80, temperature 98 F (36.7 C), temperature source Oral, resp.  rate 18, height 5' 10 (1.778 m), weight 81.2 kg, SpO2 97%. Body mass index is 25.68 kg/m.   Social History   Tobacco Use  Smoking Status Never  Smokeless Tobacco Never   Tobacco Cessation:  N/A, patient does not currently use tobacco products   Blood Alcohol level:  Lab Results  Component Value Date   [facility] <15 01/02/2024   ETH <5 12/04/2014    Metabolic Disorder Labs:  Lab Results  Component Value Date   HGBA1C 5.2 03/21/2022   MPG 102.54 03/21/2022   No results found for: PROLACTIN Lab Results  Component Value Date   CHOL 190 03/21/2022   TRIG 68 03/21/2022   HDL 48 03/21/2022   CHOLHDL 4.0 03/21/2022   VLDL 14 03/21/2022   LDLCALC 128 (H) 03/21/2022    See Psychiatric Specialty Exam and Suicide Risk  Assessment completed by Attending Physician prior to discharge.  Discharge destination:  Home  Is patient on multiple antipsychotic therapies at discharge:  No   Has Patient had three or more failed trials of antipsychotic monotherapy by history:  No  Recommended Plan for Multiple Antipsychotic Therapies: NA  Discharge Instructions     Diet - low sodium heart healthy   Complete by: As directed    Increase activity slowly   Complete by: As directed       Allergies as of 01/08/2024   No Known Allergies      Medication List     STOP taking these medications    citalopram  20 MG tablet Commonly known as: CELEXA        TAKE these medications      Indication  hydrOXYzine  50 MG tablet Commonly known as: ATARAX  Take 1 tablet (50 mg total) by mouth daily as needed for anxiety. What changed:  medication strength how much to take when to take this  Indication: Feeling Anxious   sertraline 50 MG tablet Commonly known as: ZOLOFT Take 1 tablet (50 mg total) by mouth daily. Start taking on: January 09, 2024  Indication: Major Depressive Disorder        Follow-up Information     [facility] Follow up.   Why: In person assessment for therapy and psychiatry is 01/15/24 at 2pm. Contact information: [address]  [address] [place] [address] [phone]                 Follow-up recommendations:  Activity:  as tolerated    Signed: Camelia LITTIE Lukes, PA-C 01/08/2024, 10:12 AM

## 2024-01-08 NOTE — [facility] Suicide Risk Assessment (Signed)
 [facility] Discharge Suicide Risk Assessment   Principal Problem: MDD (major depressive disorder), recurrent episode, severe (HCC) Discharge Diagnoses: Principal Problem:   MDD (major depressive disorder), recurrent episode, severe (HCC)   Total Time spent with patient: 30 minutes  Musculoskeletal: Strength & Muscle Tone: within normal limits Gait & Station: normal Patient leans: N/A  Psychiatric Specialty Exam  Presentation  General Appearance:  Appropriate for Environment  Eye Contact: Good  Speech: Clear and Coherent  Speech Volume: Normal  Handedness:No data recorded  Mood and Affect  Mood: Euthymic  Duration of Depression Symptoms: Less than two weeks  Affect: Appropriate   Thought Process  Thought Processes: Coherent; Linear  Descriptions of Associations:Intact  Orientation:Full (Time, Place and Person)  Thought Content:Logical  History of Schizophrenia/Schizoaffective disorder:No  Duration of Psychotic Symptoms:No data recorded Hallucinations:Hallucinations: None  Ideas of Reference:None  Suicidal Thoughts:Suicidal Thoughts: No  Homicidal Thoughts:Homicidal Thoughts: No   Sensorium  Memory: Immediate Fair; Recent Fair; Remote Fair  Judgment: Fair  Insight: Fair   Art Therapist  Concentration: Good  Attention Span: Good  Recall: Good  Fund of Knowledge: Good  Language: Good   Psychomotor Activity  Psychomotor Activity: Psychomotor Activity: Normal   Assets  Assets: Communication Skills; Desire for Improvement; Housing; Social Support; Physical Health   Sleep  Sleep: Sleep: Good  Estimated Sleeping Duration (Last 24 Hours): 9.00-10.75 hours (Due to Daylight Saving Time, the durations displayed may not accurately represent documentation during the time change interval)  Physical Exam: Physical Exam ROS Blood pressure 119/77, pulse 80, temperature 98 F (36.7 C), temperature source Oral, resp. rate 18,  height 5' 10 (1.778 m), weight 81.2 kg, SpO2 97%. Body mass index is 25.68 kg/m.  Mental Status Per Nursing Assessment::   On Admission:  Suicidal ideation indicated by patient, Intention to act on suicide plan  Demographic Factors:  Male  Loss Factors: Financial problems/change in socioeconomic status  Historical Factors: Prior suicide attempts, Family history of mental illness or substance abuse, and Impulsivity  Risk Reduction Factors:   Responsible for children under 23 years of age, Sense of responsibility to family, Living with another person, especially a relative, Positive social support, and Positive coping skills or problem solving skills  Continued Clinical Symptoms:  Depression:   Impulsivity  Cognitive Features That Contribute To Risk:  None    Suicide Risk:  Minimal: No identifiable suicidal ideation.  Patients presenting with no risk factors but with morbid ruminations; may be classified as minimal risk based on the severity of the depressive symptoms   Follow-up Information     [facility] Follow up.   Why: In person assessment for therapy and psychiatry is 01/15/24 at 2pm. Contact information: [address]  [address] [place] [address] [phone]                 Plan Of Care/Follow-up recommendations:  Activity:  as tolerated  Sabrena Gavitt L Ami Mally, PA-C 01/08/2024, 10:07 AM

## 2024-01-08 NOTE — Progress Notes (Signed)
  [facility] Adult Case Management Discharge Plan :  Will you be returning to the same living situation after discharge:  Yes,  Patient to return home.  At discharge, do you have transportation home?: Yes,  Patient to be transported by family.  Do you have the ability to pay for your medications: Yes,  [facility]  Release of information consent forms completed and in the chart;  Patient's signature needed at discharge.  Patient to Follow up at:  Follow-up Information     [facility] Follow up.   Why: In person assessment for therapy and psychiatry is 01/15/24 at 2pm. Contact information: [address]  [address] [place] [address] [phone]                 Next level of care provider has access to [facility] Link:no  Safety Planning and Suicide Prevention discussed: Yes,  Tommey Barret, dad, [phone]  Has patient been referred to the Quitline?: Patient does not use tobacco/nicotine products  Patient has been referred for addiction treatment: Yes, the patient will follow up with an outpatient provider for substance use disorder. Psychiatrist/APP: appointment made and Therapist: appointment made  Jacob CHRISTELLA Kerns, LCSW 01/08/2024, 9:23 AM

## 2024-01-08 NOTE — Plan of Care (Signed)
  Problem: Education: Goal: Ability to state activities that reduce stress will improve Outcome: Adequate for Discharge   Problem: Education: Goal: Ability to state activities that reduce stress will improve Outcome: Adequate for Discharge   Problem: Coping: Goal: Ability to identify and develop effective coping behavior will improve Outcome: Adequate for Discharge   Problem: Self-Concept: Goal: Ability to identify factors that promote anxiety will improve Outcome: Adequate for Discharge Goal: Level of anxiety will decrease Outcome: Adequate for Discharge Goal: Ability to modify response to factors that promote anxiety will improve Outcome: Adequate for Discharge   Problem: Health Behavior/Discharge Planning: Goal: Ability to manage health-related needs will improve Outcome: Adequate for Discharge

## 2024-01-08 NOTE — Progress Notes (Signed)
 Patient denies SI/HI/AVH at this time. Discharge instructions, AVS, prescriptions, and transition record reviewed with patient. Patient agrees to comply with medication management, follow-up visit and outpatient therapy. Patient belongings returned to patient. Patient questions and concerns addressed and answered.  Patient ambulatory off unit. Patient discharged via taxi to home.
